# Patient Record
Sex: Male | Born: 1975 | Race: White | Hispanic: No | Marital: Married | Smoking: Former smoker
Health system: Southern US, Community
[De-identification: ages and names within clinical notes are randomized; demographics above are authoritative.]

## PROBLEM LIST (undated history)

## (undated) DIAGNOSIS — T7840XA Allergy, unspecified, initial encounter: Secondary | ICD-10-CM

## (undated) DIAGNOSIS — E785 Hyperlipidemia, unspecified: Secondary | ICD-10-CM

## (undated) DIAGNOSIS — I10 Essential (primary) hypertension: Secondary | ICD-10-CM

## (undated) DIAGNOSIS — K589 Irritable bowel syndrome without diarrhea: Secondary | ICD-10-CM

## (undated) DIAGNOSIS — K219 Gastro-esophageal reflux disease without esophagitis: Secondary | ICD-10-CM

## (undated) DIAGNOSIS — K449 Diaphragmatic hernia without obstruction or gangrene: Secondary | ICD-10-CM

## (undated) DIAGNOSIS — I2510 Atherosclerotic heart disease of native coronary artery without angina pectoris: Secondary | ICD-10-CM

## (undated) DIAGNOSIS — I214 Non-ST elevation (NSTEMI) myocardial infarction: Secondary | ICD-10-CM

## (undated) DIAGNOSIS — K2970 Gastritis, unspecified, without bleeding: Secondary | ICD-10-CM

## (undated) DIAGNOSIS — K2270 Barrett's esophagus without dysplasia: Secondary | ICD-10-CM

## (undated) DIAGNOSIS — N200 Calculus of kidney: Secondary | ICD-10-CM

## (undated) HISTORY — DX: Allergy, unspecified, initial encounter: T78.40XA

## (undated) HISTORY — PX: TYMPANOSTOMY TUBE PLACEMENT: SHX32

## (undated) HISTORY — DX: Calculus of kidney: N20.0

## (undated) HISTORY — DX: Non-ST elevation (NSTEMI) myocardial infarction: I21.4

## (undated) HISTORY — PX: UPPER GASTROINTESTINAL ENDOSCOPY: SHX188

## (undated) HISTORY — DX: Diaphragmatic hernia without obstruction or gangrene: K44.9

## (undated) HISTORY — DX: Gastritis, unspecified, without bleeding: K29.70

## (undated) HISTORY — PX: CARDIAC CATHETERIZATION: SHX172

## (undated) HISTORY — DX: Irritable bowel syndrome, unspecified: K58.9

## (undated) HISTORY — DX: Barrett's esophagus without dysplasia: K22.70

## (undated) HISTORY — DX: Hyperlipidemia, unspecified: E78.5

## (undated) HISTORY — DX: Essential (primary) hypertension: I10

## (undated) HISTORY — DX: Gastro-esophageal reflux disease without esophagitis: K21.9

---

## 2004-04-17 ENCOUNTER — Emergency Department (HOSPITAL_COMMUNITY): Admission: EM | Admit: 2004-04-17 | Discharge: 2004-04-17 | Payer: Self-pay | Admitting: Emergency Medicine

## 2004-11-25 ENCOUNTER — Ambulatory Visit: Payer: Self-pay | Admitting: Family Medicine

## 2004-12-26 ENCOUNTER — Ambulatory Visit: Payer: Self-pay | Admitting: Internal Medicine

## 2005-01-16 ENCOUNTER — Ambulatory Visit: Payer: Self-pay | Admitting: Internal Medicine

## 2005-05-13 ENCOUNTER — Ambulatory Visit: Payer: Self-pay | Admitting: Internal Medicine

## 2005-09-15 ENCOUNTER — Ambulatory Visit: Payer: Self-pay | Admitting: Internal Medicine

## 2006-01-19 ENCOUNTER — Ambulatory Visit: Payer: Self-pay | Admitting: Cardiology

## 2006-01-19 ENCOUNTER — Ambulatory Visit: Payer: Self-pay | Admitting: Internal Medicine

## 2006-04-23 ENCOUNTER — Ambulatory Visit: Payer: Self-pay | Admitting: Gastroenterology

## 2006-04-25 ENCOUNTER — Emergency Department (HOSPITAL_COMMUNITY): Admission: EM | Admit: 2006-04-25 | Discharge: 2006-04-25 | Payer: Self-pay | Admitting: Emergency Medicine

## 2006-05-15 ENCOUNTER — Ambulatory Visit: Payer: Self-pay | Admitting: Gastroenterology

## 2006-05-15 ENCOUNTER — Encounter (INDEPENDENT_AMBULATORY_CARE_PROVIDER_SITE_OTHER): Payer: Self-pay | Admitting: Specialist

## 2006-05-15 DIAGNOSIS — K2970 Gastritis, unspecified, without bleeding: Secondary | ICD-10-CM | POA: Insufficient documentation

## 2006-05-15 DIAGNOSIS — K2990 Gastroduodenitis, unspecified, without bleeding: Secondary | ICD-10-CM

## 2006-05-15 DIAGNOSIS — K2270 Barrett's esophagus without dysplasia: Secondary | ICD-10-CM | POA: Insufficient documentation

## 2006-06-16 ENCOUNTER — Ambulatory Visit: Payer: Self-pay | Admitting: Gastroenterology

## 2006-06-22 ENCOUNTER — Ambulatory Visit: Payer: Self-pay | Admitting: Gastroenterology

## 2006-07-17 ENCOUNTER — Ambulatory Visit: Payer: Self-pay | Admitting: Internal Medicine

## 2007-02-26 ENCOUNTER — Ambulatory Visit: Payer: Self-pay | Admitting: Gastroenterology

## 2007-03-15 ENCOUNTER — Observation Stay (HOSPITAL_COMMUNITY): Admission: EM | Admit: 2007-03-15 | Discharge: 2007-03-16 | Payer: Self-pay | Admitting: Emergency Medicine

## 2007-03-15 ENCOUNTER — Ambulatory Visit: Payer: Self-pay | Admitting: Internal Medicine

## 2007-03-18 ENCOUNTER — Telehealth (INDEPENDENT_AMBULATORY_CARE_PROVIDER_SITE_OTHER): Payer: Self-pay | Admitting: *Deleted

## 2007-11-17 ENCOUNTER — Encounter: Payer: Self-pay | Admitting: Gastroenterology

## 2007-11-17 DIAGNOSIS — K589 Irritable bowel syndrome without diarrhea: Secondary | ICD-10-CM

## 2007-11-17 DIAGNOSIS — K219 Gastro-esophageal reflux disease without esophagitis: Secondary | ICD-10-CM | POA: Insufficient documentation

## 2007-11-17 DIAGNOSIS — K449 Diaphragmatic hernia without obstruction or gangrene: Secondary | ICD-10-CM | POA: Insufficient documentation

## 2008-04-21 IMAGING — CR DG CHEST 2V
2 series · 2 of 2 positions shown · non-contrast
Comparison: none

CLINICAL DATA: Syncope, diarrhea.  Passing out.  Superior abdominal pain. 
 CHEST - 2 VIEW:

[w chest lat]
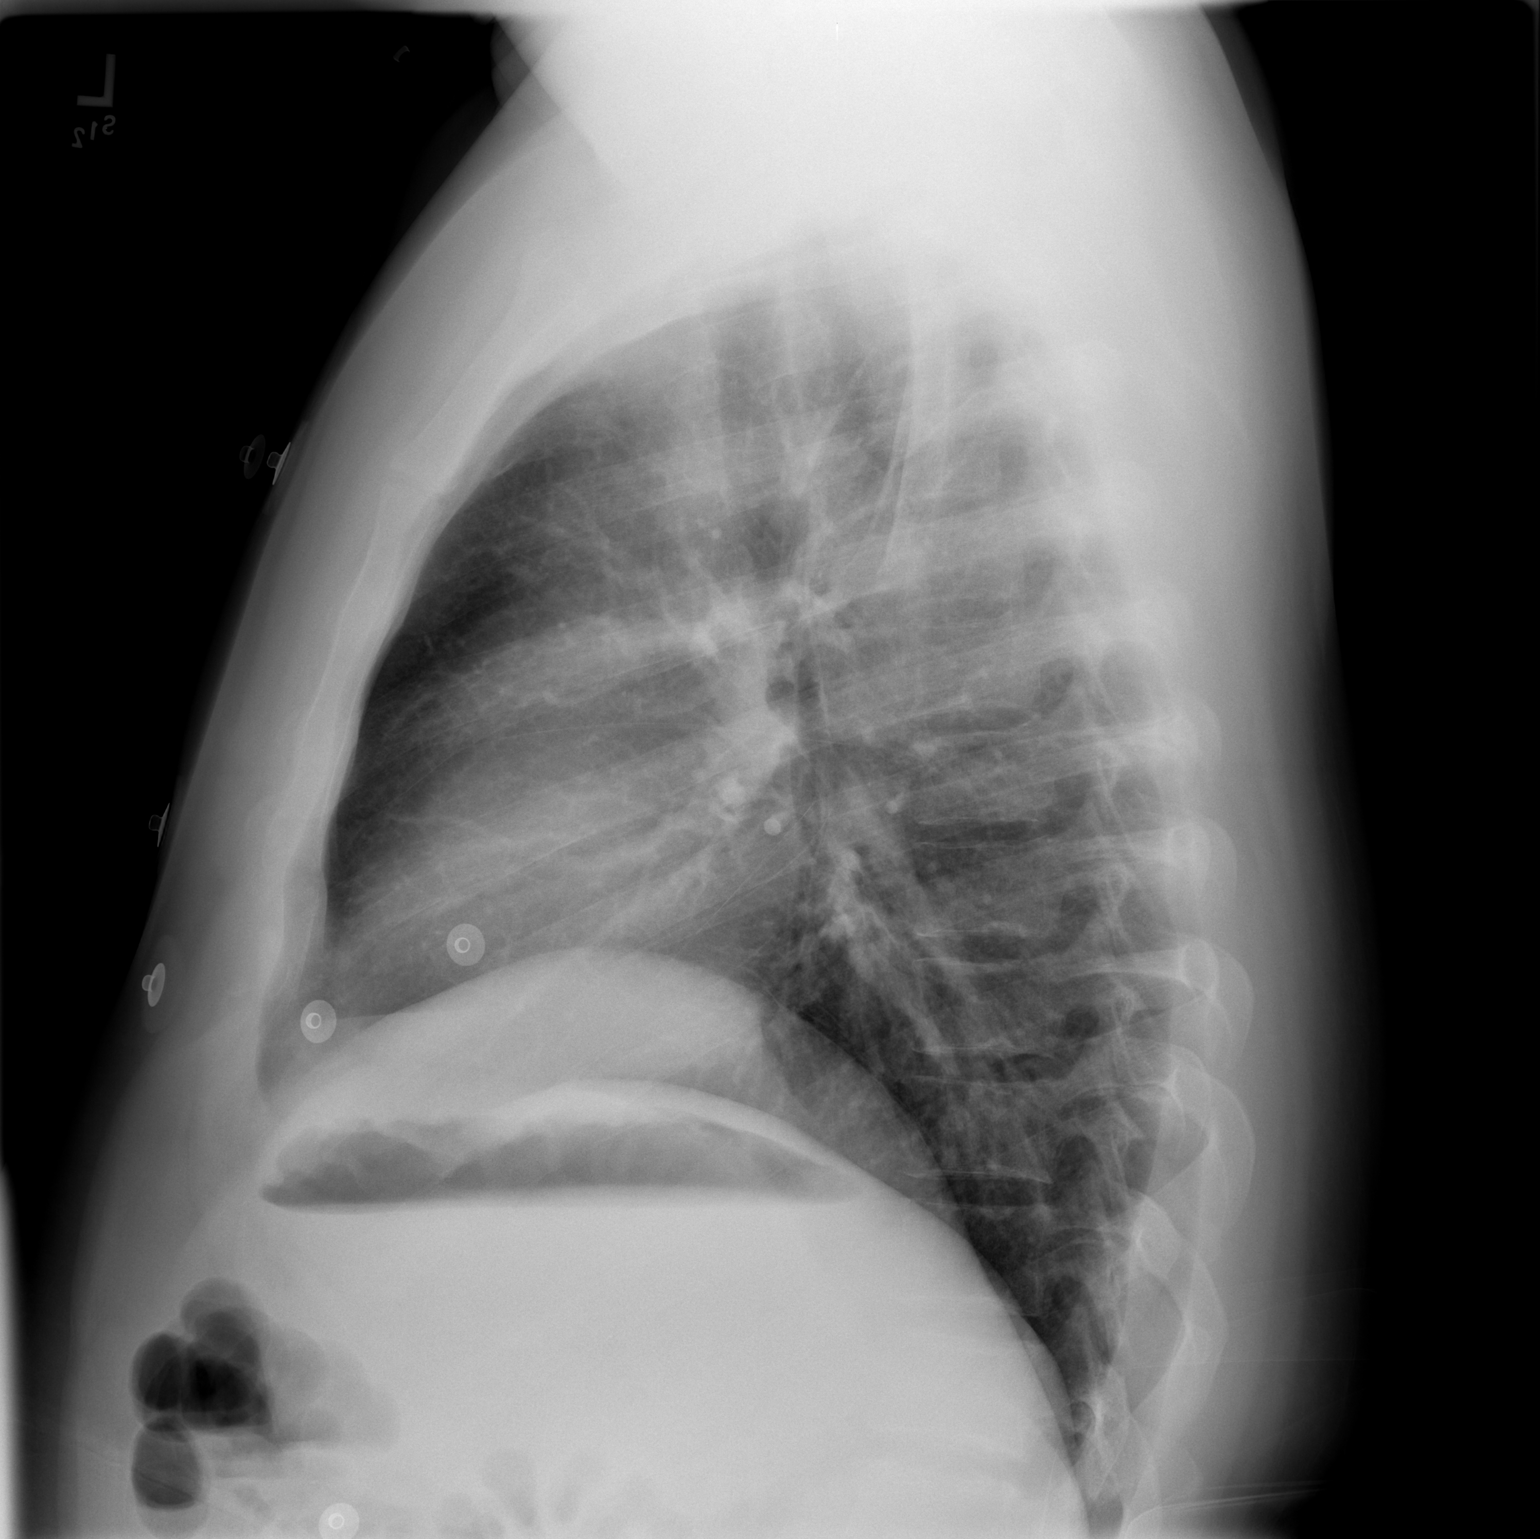

[w chest pa]
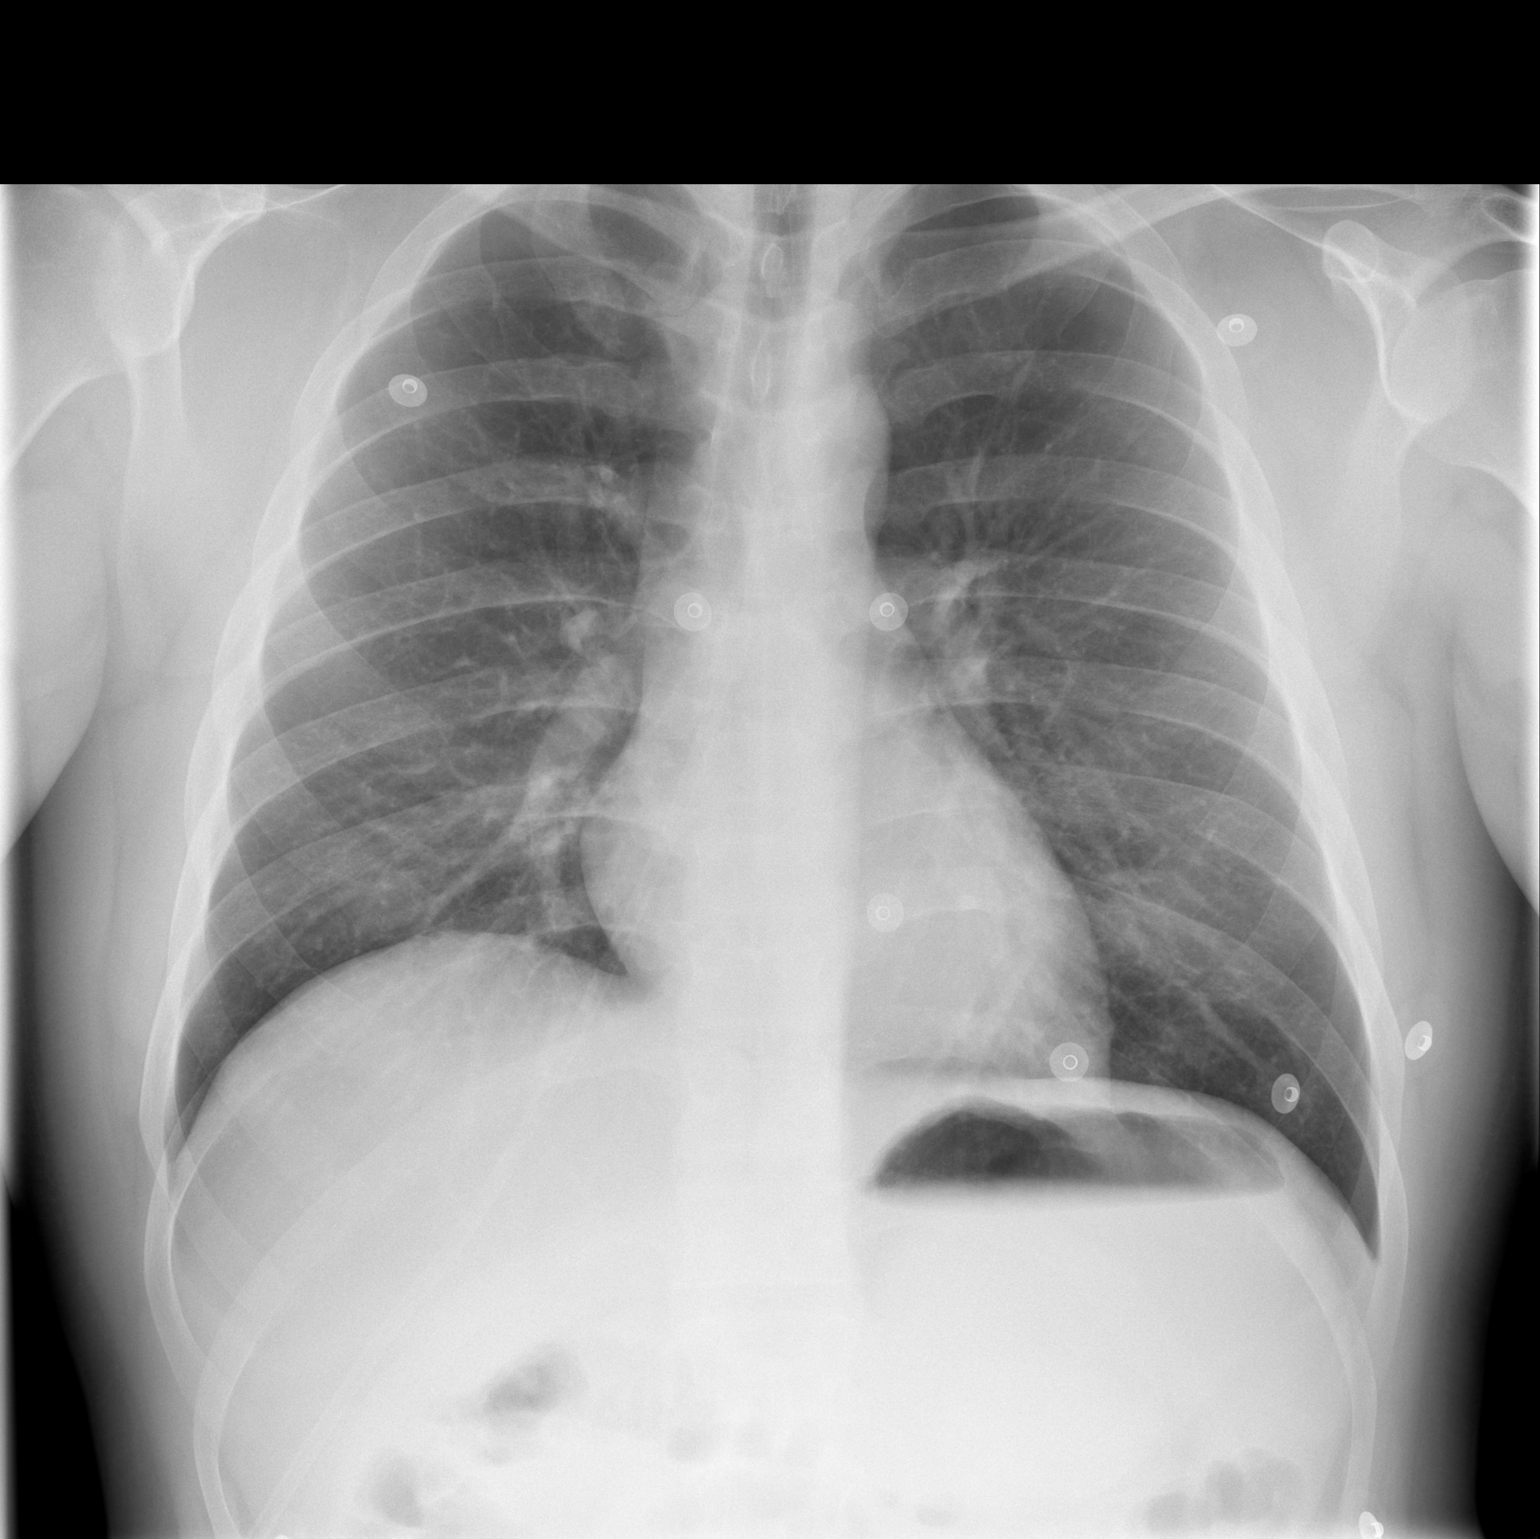

[2 of 2 positions shown; findings below may reference images not displayed]

FINDINGS: The heart size and mediastinal contours are within normal limits.  Both lungs are clear.  The visualized skeletal structures are unremarkable.
IMPRESSION: No active cardiopulmonary disease.

## 2008-04-21 IMAGING — CR DG ABDOMEN 2V
2 series · 2 of 2 positions shown · non-contrast
Comparison: None.

CLINICAL DATA: Nausea vomiting and diarrhea

[w abdomen upright]
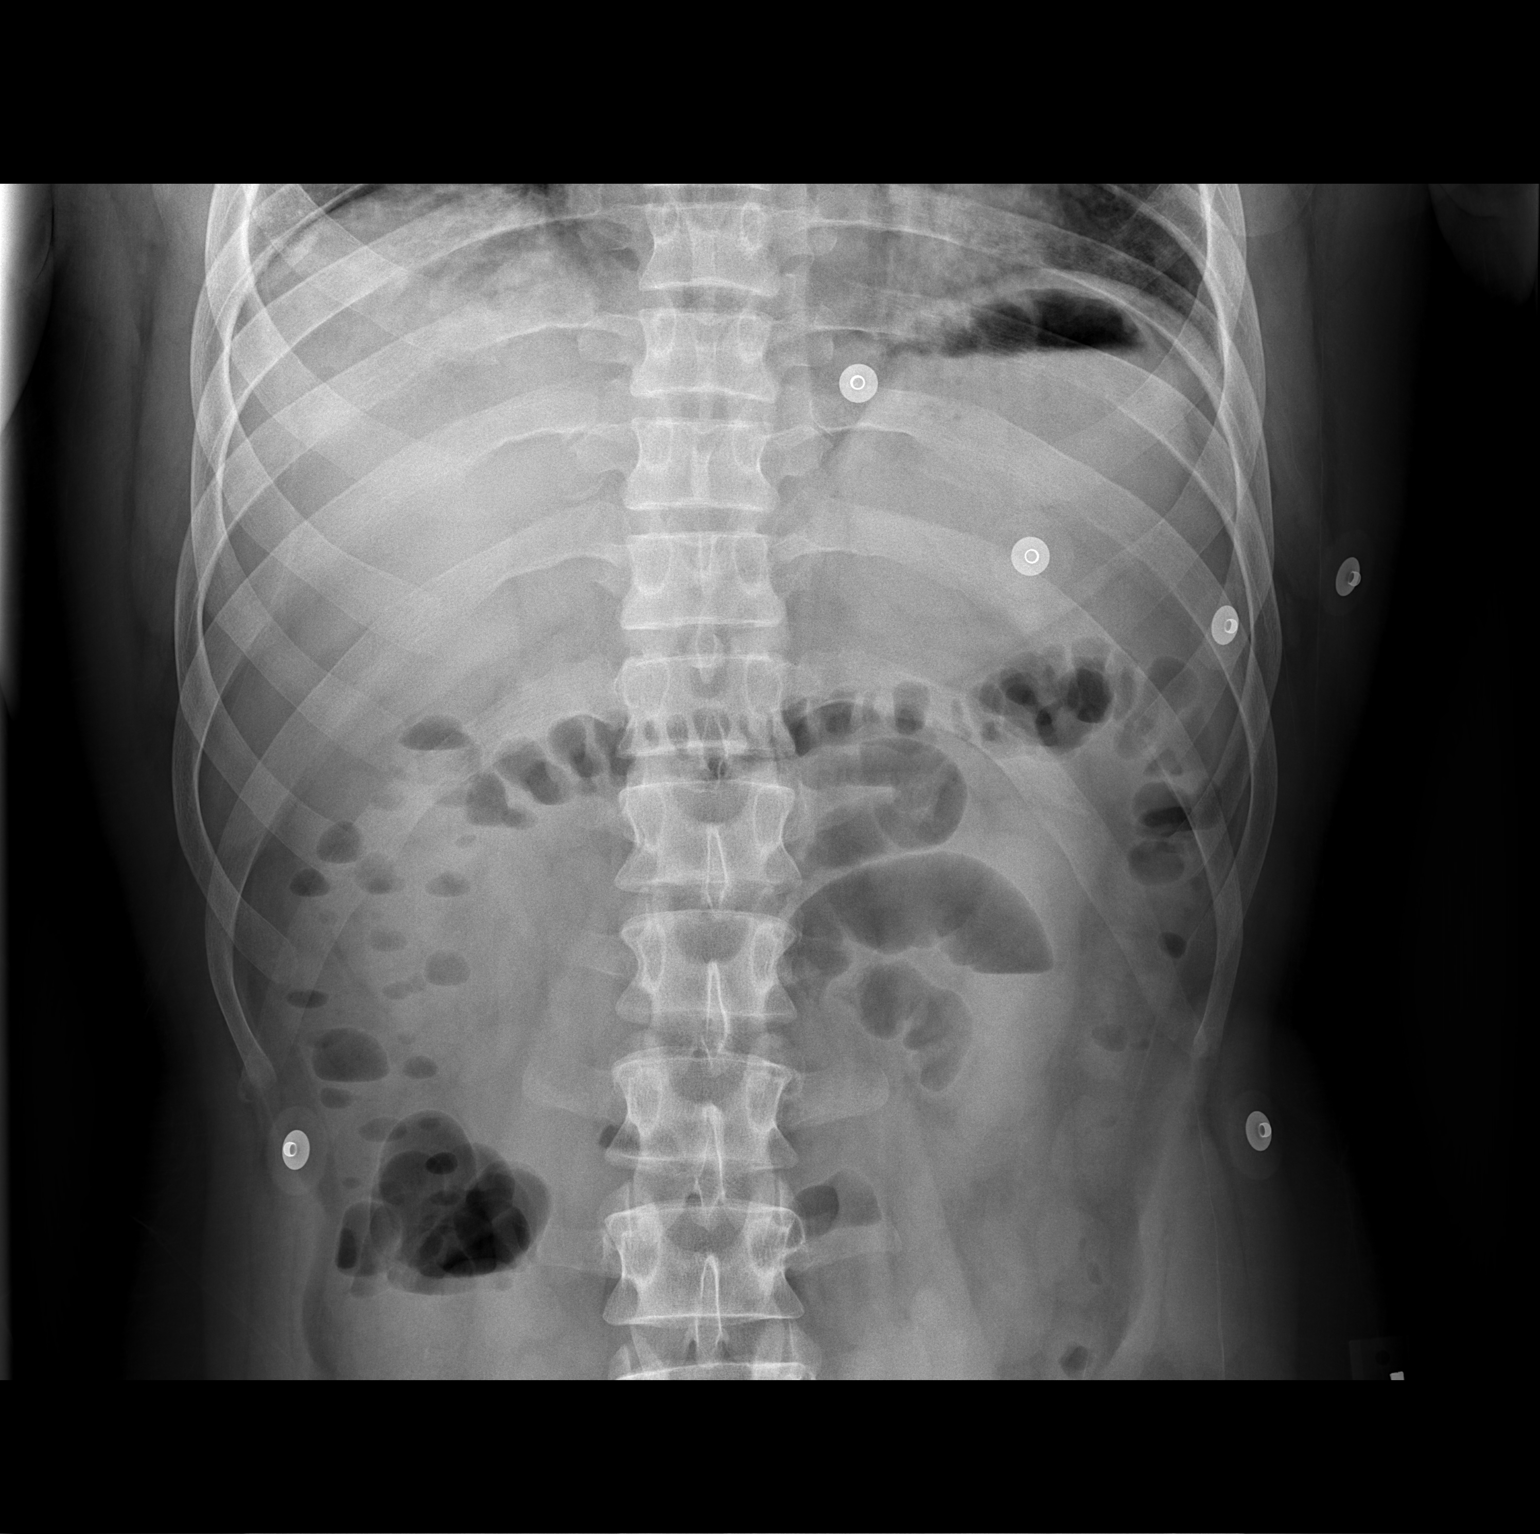

[t abdomen supine]
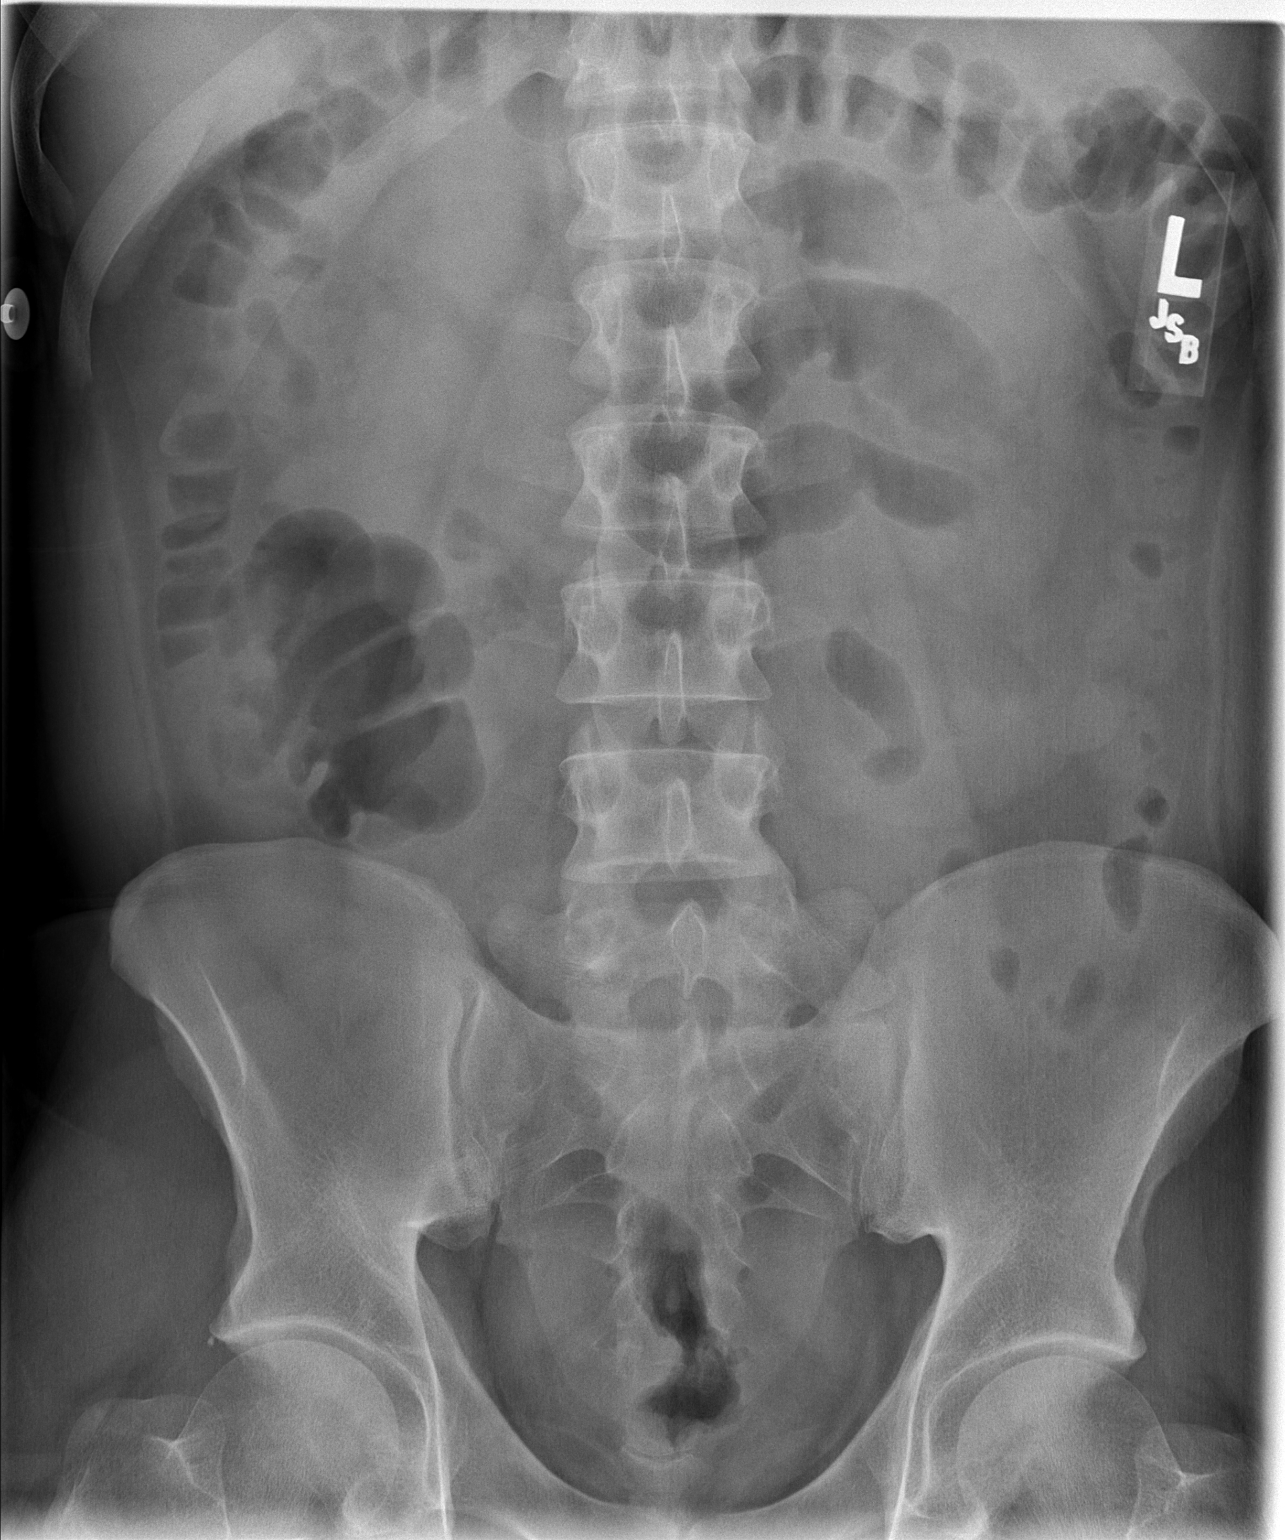

[2 of 2 positions shown; findings below may reference images not displayed]

ABDOMEN - 2 VIEW:

Upright film shows no intraperitoneal free air. There is bibasilar atelectasis
or infiltrate in the lung bases. Scattered tiny air-fluid levels are seen in the
nondilated colon. Some central loops of mildly distended small bowel are noted
in the upper abdomen.

Supine film shows no gaseous bowel dilation suggest obstruction.
IMPRESSION: No intraperitoneal free air.

Essentially nonspecific gas pattern. There is no gaseous bowel dilation to
suggest obstruction. Small air-fluid levels in the right colon can be a normal
finding. There is mild gaseous distention of small bowel in the central abdomen.

## 2008-06-06 ENCOUNTER — Telehealth (INDEPENDENT_AMBULATORY_CARE_PROVIDER_SITE_OTHER): Payer: Self-pay | Admitting: *Deleted

## 2008-06-07 ENCOUNTER — Ambulatory Visit: Payer: Self-pay | Admitting: Family Medicine

## 2008-06-07 DIAGNOSIS — IMO0001 Reserved for inherently not codable concepts without codable children: Secondary | ICD-10-CM | POA: Insufficient documentation

## 2008-06-15 ENCOUNTER — Telehealth (INDEPENDENT_AMBULATORY_CARE_PROVIDER_SITE_OTHER): Payer: Self-pay | Admitting: *Deleted

## 2008-06-16 ENCOUNTER — Ambulatory Visit: Payer: Self-pay | Admitting: Internal Medicine

## 2008-06-16 LAB — CONVERTED CEMR LAB
Inflenza A Ag: NEGATIVE
Influenza B Ag: NEGATIVE

## 2008-09-19 ENCOUNTER — Observation Stay (HOSPITAL_COMMUNITY): Admission: EM | Admit: 2008-09-19 | Discharge: 2008-09-20 | Payer: Self-pay | Admitting: Emergency Medicine

## 2008-09-22 ENCOUNTER — Ambulatory Visit: Payer: Self-pay | Admitting: Internal Medicine

## 2008-09-22 ENCOUNTER — Encounter (INDEPENDENT_AMBULATORY_CARE_PROVIDER_SITE_OTHER): Payer: Self-pay | Admitting: *Deleted

## 2008-09-22 DIAGNOSIS — K5289 Other specified noninfective gastroenteritis and colitis: Secondary | ICD-10-CM

## 2010-01-01 ENCOUNTER — Encounter (INDEPENDENT_AMBULATORY_CARE_PROVIDER_SITE_OTHER): Payer: Self-pay | Admitting: *Deleted

## 2010-01-01 ENCOUNTER — Ambulatory Visit: Payer: Self-pay | Admitting: Internal Medicine

## 2010-01-01 DIAGNOSIS — R42 Dizziness and giddiness: Secondary | ICD-10-CM | POA: Insufficient documentation

## 2010-01-01 DIAGNOSIS — R6884 Jaw pain: Secondary | ICD-10-CM | POA: Insufficient documentation

## 2010-01-01 DIAGNOSIS — R079 Chest pain, unspecified: Secondary | ICD-10-CM

## 2010-01-01 DIAGNOSIS — E785 Hyperlipidemia, unspecified: Secondary | ICD-10-CM

## 2010-01-02 ENCOUNTER — Telehealth: Payer: Self-pay | Admitting: Internal Medicine

## 2010-01-02 ENCOUNTER — Encounter: Payer: Self-pay | Admitting: Internal Medicine

## 2010-01-07 LAB — CONVERTED CEMR LAB
ALT: 17 units/L (ref 0–53)
AST: 18 units/L (ref 0–37)
Basophils Relative: 0.2 % (ref 0.0–3.0)
Bilirubin, Direct: 0.1 mg/dL (ref 0.0–0.3)
Chloride: 99 meq/L (ref 96–112)
Eosinophils Relative: 1.1 % (ref 0.0–5.0)
GFR calc non Af Amer: 87.28 mL/min (ref >60)
HCT: 45.7 % (ref 39.0–52.0)
Lymphs Abs: 1.3 10*3/uL (ref 0.7–4.0)
MCV: 90.6 fL (ref 78.0–100.0)
Monocytes Absolute: 0.4 10*3/uL (ref 0.1–1.0)
Monocytes Relative: 5.7 % (ref 3.0–12.0)
Neutrophils Relative %: 74.7 % (ref 43.0–77.0)
Potassium: 4.4 meq/L (ref 3.5–5.1)
RBC: 5.04 M/uL (ref 4.22–5.81)
Total Protein: 7.9 g/dL (ref 6.0–8.3)
WBC: 6.9 10*3/uL (ref 4.5–10.5)

## 2010-01-22 ENCOUNTER — Ambulatory Visit: Payer: Self-pay | Admitting: Internal Medicine

## 2010-01-22 DIAGNOSIS — J309 Allergic rhinitis, unspecified: Secondary | ICD-10-CM | POA: Insufficient documentation

## 2010-01-22 LAB — CONVERTED CEMR LAB: LDL Goal: 160 mg/dL

## 2010-02-15 ENCOUNTER — Telehealth (INDEPENDENT_AMBULATORY_CARE_PROVIDER_SITE_OTHER): Payer: Self-pay | Admitting: *Deleted

## 2010-04-24 ENCOUNTER — Telehealth (INDEPENDENT_AMBULATORY_CARE_PROVIDER_SITE_OTHER): Payer: Self-pay | Admitting: *Deleted

## 2010-04-26 ENCOUNTER — Ambulatory Visit: Payer: Self-pay | Admitting: Internal Medicine

## 2010-04-29 LAB — CONVERTED CEMR LAB
ALT: 15 units/L (ref 0–53)
Bilirubin, Direct: 0.1 mg/dL (ref 0.0–0.3)
Direct LDL: 291.5 mg/dL
HDL: 35.2 mg/dL — ABNORMAL LOW (ref >39.00)
Total Bilirubin: 0.9 mg/dL (ref 0.3–1.2)
Total CHOL/HDL Ratio: 9
Triglycerides: 88 mg/dL (ref 0.0–149.0)
VLDL: 17.6 mg/dL (ref 0.0–40.0)

## 2010-06-17 ENCOUNTER — Encounter: Payer: Self-pay | Admitting: Internal Medicine

## 2010-07-03 ENCOUNTER — Telehealth: Payer: Self-pay | Admitting: Internal Medicine

## 2010-07-10 ENCOUNTER — Encounter: Payer: Self-pay | Admitting: Internal Medicine

## 2010-07-10 ENCOUNTER — Ambulatory Visit: Payer: Self-pay | Admitting: Internal Medicine

## 2010-07-10 DIAGNOSIS — M2550 Pain in unspecified joint: Secondary | ICD-10-CM

## 2010-07-10 DIAGNOSIS — R259 Unspecified abnormal involuntary movements: Secondary | ICD-10-CM | POA: Insufficient documentation

## 2010-07-10 DIAGNOSIS — R5383 Other fatigue: Secondary | ICD-10-CM | POA: Insufficient documentation

## 2010-07-10 DIAGNOSIS — R5381 Other malaise: Secondary | ICD-10-CM

## 2010-07-10 DIAGNOSIS — R41840 Attention and concentration deficit: Secondary | ICD-10-CM | POA: Insufficient documentation

## 2010-07-12 LAB — CONVERTED CEMR LAB
AST: 25 units/L (ref 0–37)
Albumin: 4.5 g/dL (ref 3.5–5.2)
Alkaline Phosphatase: 60 units/L (ref 39–117)
BUN: 16 mg/dL (ref 6–23)
Basophils Relative: 0.2 % (ref 0.0–3.0)
Bilirubin, Direct: 0.1 mg/dL (ref 0.0–0.3)
Creatinine, Ser: 1.2 mg/dL (ref 0.4–1.5)
Eosinophils Relative: 1.4 % (ref 0.0–5.0)
GFR calc non Af Amer: 77.55 mL/min (ref >60.00)
Glucose, Bld: 110 mg/dL — ABNORMAL HIGH (ref 70–99)
HCT: 44.8 % (ref 39.0–52.0)
Hemoglobin: 15.3 g/dL (ref 13.0–17.0)
Hgb A1c MFr Bld: 5.6 % (ref 4.6–6.5)
Lymphs Abs: 2 10*3/uL (ref 0.7–4.0)
MCV: 89.7 fL (ref 78.0–100.0)
Monocytes Absolute: 0.4 10*3/uL (ref 0.1–1.0)
Monocytes Relative: 6.8 % (ref 3.0–12.0)
Neutro Abs: 3.6 10*3/uL (ref 1.4–7.7)
Potassium: 5.1 meq/L (ref 3.5–5.1)
RBC: 4.99 M/uL (ref 4.22–5.81)
Total CK: 86 units/L (ref 7–232)
Total Protein: 7.7 g/dL (ref 6.0–8.3)
WBC: 6.1 10*3/uL (ref 4.5–10.5)

## 2010-07-30 ENCOUNTER — Telehealth: Payer: Self-pay | Admitting: Internal Medicine

## 2010-08-01 ENCOUNTER — Ambulatory Visit
Admission: RE | Admit: 2010-08-01 | Discharge: 2010-08-01 | Payer: Self-pay | Source: Home / Self Care | Attending: Internal Medicine | Admitting: Internal Medicine

## 2010-08-01 ENCOUNTER — Encounter: Payer: Self-pay | Admitting: Internal Medicine

## 2010-08-01 DIAGNOSIS — M2550 Pain in unspecified joint: Secondary | ICD-10-CM | POA: Insufficient documentation

## 2010-08-20 ENCOUNTER — Encounter: Payer: Self-pay | Admitting: Internal Medicine

## 2010-09-03 NOTE — Progress Notes (Signed)
Summary: Additional labs/symptoms  Phone Note Call from Patient Call back at [phone]   Caller: Mom Summary of Call: Patient mom called noting that the patient recently has sinus infection and has even seen ENT. She notes that he would like a full blood work up , especially his iron levels. She would like to know if this can be added to labs he already needs (Lipids, hepatic panel, CK ; 272.4, 995.2). Please advise. Initial call taken by: Lucious Groves CMA,  July 03, 2010 12:30 PM  Follow-up for Phone Call        CBC & dif ( 461.0) , iron could done if anemic. Otherwise insurance won't cover it Follow-up by: Marga Melnick MD,  July 03, 2010 1:24 PM  Additional Follow-up for Phone Call Additional follow up Details #1::        Patient advised of the above and notes that he is having fatigue, muscles feel weak/poor motor skills, slight dizziness, and confusion.   Appt made next week per patient request. Additional Follow-up by: Lucious Groves CMA,  July 03, 2010 3:27 PM

## 2010-09-03 NOTE — Consult Note (Signed)
Summary: [facility]  [facility]   Imported By: Lanelle Bal 06/26/2010 13:44:58  _____________________________________________________________________  External Attachment:    Type:   Image     Comment:   External Document

## 2010-09-03 NOTE — Assessment & Plan Note (Signed)
Summary: congested -painin head  above neck/cbs   Vital Signs:  Patient profile:   35 year old male Height:      70 inches Weight:      221 pounds BMI:     31.82 BSA:     2.18 Temp:     97.5 degrees F oral Pulse rate:   96 / minute Pulse rhythm:   regular Resp:     16 per minute BP sitting:   116 / 74  (left arm) Cuff size:   large  Vitals Entered By: Ok Anis CMA (January 22, 2010 9:20 AM)                               CC:  Pressure in head and and ear ache.  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 NMR reviewed & risk discussed. He quit smoking 5 years ago. He is physically active on his job @ [employer]. No specific diet but decreased portions.  Lipid Management History:      Positive NCEP/ATP III risk factors include family history for ischemic heart disease (females less than 80 years old & males less than 55 years old).  Negative NCEP/ATP III risk factors include male age less than 66 years old, non-diabetic, non-tobacco-user status, non-hypertensive, no ASHD (atherosclerotic heart disease), no prior stroke/TIA, no peripheral vascular disease, and no history of aortic aneurysm.     Preventive Screening-Counseling & Management  Alcohol-Tobacco     Smoking Status: quit  Current Medications (verified): 1)  Ranitidine Hcl 150 Mg Tabs (Ranitidine Hcl) .Marland Kitchen.. 1 Two Times A Day Pre Meals As Needed 2)  Claritin 10 Mg Tabs (Loratadine) .... One Tablet By Mouth Once Daily  Allergies (verified): 1)  ! Aciphex (Rabeprazole Sodium) 2)  ! Nexium (Esomeprazole Magnesium) 3)  ! Zegerid (Omeprazole-Sodium Bicarbonate)  Past History:  Past Medical History: GERD (ICD-530.81) Hx of IBS (ICD-564.1) GASTRITIS (ICD-535.50) BARRETTS ESOPHAGUS (ICD-530.85) HIATAL HERNIA (ICD-553.3) Hyperlipidemia: NMR 2011: LDL 347(>3500/3223), HDL 34, TG 149.LDL goal = < 140, ideally < 105. Framingham Study LDL goal = <  160.  Social History: Smoking Status:  quit  Review of Systems General:  Complains of sweats; denies chills and fever. ENT:  Complains of nasal congestion and postnasal drainage; No purulence.  Physical Exam  General:  well-nourished,in no acute distress; alert,appropriate and cooperative throughout examination Ears:  L ear normal.   Wax on R Nose:  External nasal examination shows no deformity or inflammation. Nasal mucosa are pink and moist without lesions or exudates. Mouth:  Oral mucosa and oropharynx without lesions or exudates.  Teeth in good repair. Tonsils enlarged Heart:  Normal rate and regular rhythm. S1 and S2 normal without gallop, murmur, click, rub or other extra sounds. Pulses:  R and L carotid,radial,dorsalis pedis and posterior tibial pulses are full and equal bilaterally Extremities:  Small xanthelasma over R dorsal MCP joints Neurologic:  alert & oriented X3.     Impression & Recommendations:  Problem # 1:  HYPERLIPIDEMIA (ICD-272.4)  His updated medication list for this problem includes:    Pravastatin Sodium 40 Mg Tabs (Pravastatin sodium) .Marland Kitchen... 1 at bedtime  Problem # 2:  RHINITIS (ICD-477.9)  His updated medication list for this problem includes:    Claritin 10 Mg Tabs (Loratadine) ..... One tablet by mouth once daily    Fluticasone Propionate 50 Mcg/act Susp (Fluticasone  propionate) .Marland Kitchen... 1 spray two times a day as needed  Complete Medication List: 1)  Ranitidine Hcl 150 Mg Tabs (Ranitidine hcl) .Marland Kitchen.. 1 two times a day pre meals as needed 2)  Claritin 10 Mg Tabs (Loratadine) .... One tablet by mouth once daily 3)  Fluticasone Propionate 50 Mcg/act Susp (Fluticasone propionate) .Marland Kitchen.. 1 spray two times a day as needed 4)  Pravastatin Sodium 40 Mg Tabs (Pravastatin sodium) .Marland Kitchen.. 1 at bedtime  Lipid Assessment/Plan:      Based on NCEP/ATP III, the patient's risk factor category is "0-1 risk factors".  The patient's lipid goals are as follows: Total  cholesterol goal is 200; LDL cholesterol goal is 160; HDL cholesterol goal is 40; Triglyceride goal is 150.  His LDL cholesterol goal has not been met.  Secondary causes for hyperlipidemia have been ruled out.  He has been counseled on adjunctive measures for lowering his cholesterol and has been provided with dietary instructions.    Patient Instructions: 1)  Neti pot once daily as needed for head congestion. 2)  Please schedule a follow-up appointment in 3 months. 3)  Hepatic Panel prior to visit, ICD-9:995.20 4)  Lipid Panel prior to visit, ICD-9:272.4 5)  Take an  81 mg COATED Aspirin every day 1/2 through b'fast. Read Dr Gildardo Griffes book Eat, Drink & Be Healthy for dietary information on cholesterol.. Prescriptions: PRAVASTATIN SODIUM 40 MG TABS (PRAVASTATIN SODIUM) 1 at bedtime  #90 x 0   Entered and Authorized by:   Marga Melnick MD   Signed by:   Marga Melnick MD on 01/22/2010   Method used:   Print then Give to Patient   RxID:   [id number] FLUTICASONE PROPIONATE 50 MCG/ACT SUSP (FLUTICASONE PROPIONATE) 1 spray two times a day as needed  #1 x 11   Entered and Authorized by:   Marga Melnick MD   Signed by:   Marga Melnick MD on 01/22/2010   Method used:   Print then Give to Patient   RxID:   [id number]

## 2010-09-03 NOTE — Progress Notes (Signed)
Summary: Referral-Urgent  Phone Note Call from Patient   Caller: Mom Summary of Call: Mother calls and states that she wants a referral for her son for Dr. Elba Barman and Throat. Son has seen Dr. Alwyn Ren twice with the same issues..headache,ear problems etc. She states that they believed maybe going to the beach would help him but he has still been sick. She states that he has been using the nedipod  like Dr. Alwyn Ren told him to but its just a temporary fix. Wants a referral to the specalist for Monday if possible. Trey Paula will still be off work. Please contact pt at [phone] today to let him know if he can see Dr. Jearld Fenton Monday.  Initial call taken by: Lavell Islam,  February 15, 2010 12:19 PM  Follow-up for Phone Call        Referral placed, Marisue Ivan( referral coor.) is aware  Follow-up by: Shonna Chock CMA,  February 15, 2010 1:25 PM

## 2010-09-03 NOTE — Progress Notes (Signed)
Summary: PRAVASTATIN 30 DAY SUPPLY  Phone Note Call from Patient   Caller: Mom Summary of Call: NEEDS 30 DAY REFILL FOR PRAVASTATIN CALLED TO [facility] [facility] [place]  PATIENT THINKS DR HOPPER WILL NEED TO REVIEW LABS (WILL DO LABS ON FRIDAY 9/23) AND MAY CHANGE MEDS BASED ON LABS, SO HE ONLY WANTS A 30 DAY SUPPLY Initial call taken by: Jerolyn Shin,  April 24, 2010 3:21 PM    Prescriptions: PRAVASTATIN SODIUM 40 MG TABS (PRAVASTATIN SODIUM) 1 at bedtime  #30 x 0   Entered by:   Shonna Chock CMA   Authorized by:   Marga Melnick MD   Signed by:   Shonna Chock CMA on 04/24/2010   Method used:   Electronically to        [facility]retail[facility]       [address]       [place], [place]  [address]       Ph: [phone]       Fax: [phone]   RxID:   [id number]

## 2010-09-03 NOTE — Assessment & Plan Note (Signed)
Summary: C/O fatigue, poor motor skills confusion,requesting iron stud...   Vital Signs:  Patient profile:   35 year old male Weight:      220.2 pounds BMI:     31.71 Temp:     97.7 degrees F oral Pulse rate:   96 / minute Resp:     14 per minute BP sitting:   126 / 80  (left arm) Cuff size:   large  Vitals Entered By: Shonna Chock CMA (July 10, 2010 9:08 AM) CC: C/O of several symptoms: fatigue, eye twitching/pressure, ear pain, congestion, cough, joint pain, and hard time concentrating. Symptoms off/on x 1.5 year(s) and worse in the fall season, Fatigue   CC:  C/O of several symptoms: fatigue, eye twitching/pressure, ear pain, congestion, cough, joint pain, and hard time concentrating. Symptoms off/on x 1.5 year(s) and worse in the fall season, and Fatigue.  History of Present Illness: Fatigue      This is a 35 year old man who presents with intermittent fatigue , joint pains diffusely , muscle weakness & aching.Intermittent twitching OD.  The patient reports  fatigue even  without physical  activity ,  primarily a  physical , not motivational fatigue.  The patient also reports intermittent  night sweats and dyspnea.  The patient denies fever, weight loss, exertional chest pain, cough, and hemoptysis.  Other symptoms include severe snoring w/o apnea.  The patient denies the following symptoms: leg swelling, orthopnea, PND, melena, adenopathy, daytime sleepiness, and skin changes.  The patient denies feeling depressed, altered appetite, and poor sleep.  On Simvastatin 6 weeks.   Current Medications (verified): 1)  Zyrtec Hives Relief 10 Mg Tabs (Cetirizine Hcl) .Marland Kitchen.. 1 By Mouth Once Daily 2)  Simvastatin 40 Mg Tabs (Simvastatin) .Marland Kitchen.. 1 At Bedtime 3)  Aspirin 81 Mg Tabs (Aspirin) .Marland Kitchen.. 1 By Mouth At Bedtime  Allergies: 1)  ! Aciphex (Rabeprazole Sodium) 2)  ! Nexium (Esomeprazole Magnesium) 3)  ! Zegerid (Omeprazole-Sodium Bicarbonate)  Review of Systems General:  Denies  chills. Eyes:  Denies blurring, double vision, and vision loss-both eyes. ENT:  Denies difficulty swallowing and hoarseness. CV:  Denies palpitations. GI:  Denies constipation and diarrhea. MS:  Denies joint redness, joint swelling, low back pain, and stiffness. Derm:  Denies changes in nail beds, hair loss, lesion(s), and rash. Neuro:  Complains of difficulty with concentration and memory loss; denies numbness; R facial tingling. Memory & concentration as well as other symptoms  better with multigrain cereals. Endo:  Denies cold intolerance and heat intolerance.  Physical Exam  General:  well-nourished,in no acute distress; alert,appropriate and cooperative throughout examination Eyes:  No corneal or conjunctival inflammation noted. EOMI. Perrla. Field of  Vision grossly normal. no icterus Mouth:  Oral mucosa and oropharynx without lesions or exudates.  Tongue not deviated Neck:  No deformities, masses, or tenderness noted. Lungs:  Normal respiratory effort, chest expands symmetrically. Lungs are clear to auscultation, no crackles or wheezes. Heart:  Normal rate and regular rhythm. S1 and S2 normal without gallop, murmur, click, rub or other extra sounds. Abdomen:  Bowel sounds positive,abdomen soft and non-tender without masses, organomegaly or hernias noted. Pulses:  R and L carotid,radial,dorsalis pedis and posterior tibial pulses are full and equal bilaterally Extremities:  No clubbing, cyanosis, edema, or deformity noted with normal full range of motion of all joints.   Neurologic:  alert & oriented X3 and DTRs symmetrical and normal.   Skin:  Intact without suspicious lesions or rashes Cervical Nodes:  No lymphadenopathy noted Axillary Nodes:  No palpable lymphadenopathy Psych:  memory intact for recent and remote, normally interactive, and good eye contact.     Impression & Recommendations:  Problem # 1:  FATIGUE (ICD-780.79)  Orders: Venipuncture (40981) TLB-CBC Platelet -  w/Differential (85025-CBCD) TLB-B12 + Folate Pnl (19147_82956-O13/YQM) TLB-TSH (Thyroid Stimulating Hormone) (84443-TSH)  Problem # 2:  ATTENTION OR CONCENTRATION DEFICIT (ICD-799.51)  Orders: Venipuncture (57846) TLB-B12 + Folate Pnl (96295_28413-K44/WNU)  Problem # 3:  MUSCLE FASCICULATIONS (ICD-781.0)  Orders: Venipuncture (27253) TLB-BMP (Basic Metabolic Panel-BMET) (80048-METABOL) TLB-CK Total Only(Creatine Kinase/CPK) (82550-CK) TLB-Magnesium (Mg) (83735-MG)  Problem # 4:  HYPERLIPIDEMIA (ICD-272.4)  His updated medication list for this problem includes:    Simvastatin 40 Mg Tabs (Simvastatin) .Marland Kitchen... 1 at bedtime  Orders: Venipuncture (66440) TLB-Hepatic/Liver Function Pnl (80076-HEPATIC) T- * Misc. Laboratory test [phone])  Problem # 5:  CORONARY ARTERY DISEASE, PREMATURE, FAMILY HX (ICD-V17.3)  Orders: Venipuncture (59563) T- * Misc. Laboratory test [phone])  Complete Medication List: 1)  Zyrtec Hives Relief 10 Mg Tabs (Cetirizine hcl) .Marland Kitchen.. 1 by mouth once daily 2)  Simvastatin 40 Mg Tabs (Simvastatin) .Marland Kitchen.. 1 at bedtime 3)  Aspirin 81 Mg Tabs (Aspirin) .Marland Kitchen.. 1 by mouth at bedtime  Other Orders: TLB-Sedimentation Rate (ESR) (85652-ESR) T-Rheumatoid Factor (33295-18841)  Patient Instructions: 1)  Verify with spouse as to any apnea   Orders Added: 1)  Est. Patient Level IV [66063] 2)  Venipuncture [01601] 3)  TLB-CBC Platelet - w/Differential [85025-CBCD] 4)  TLB-B12 + Folate Pnl [82746_82607-B12/FOL] 5)  TLB-Hepatic/Liver Function Pnl [80076-HEPATIC] 6)  TLB-TSH (Thyroid Stimulating Hormone) [84443-TSH] 7)  TLB-BMP (Basic Metabolic Panel-BMET) [80048-METABOL] 8)  TLB-CK Total Only(Creatine Kinase/CPK) [82550-CK] 9)  TLB-Magnesium (Mg) [83735-MG] 10)  TLB-Sedimentation Rate (ESR) [85652-ESR] 11)  T-Rheumatoid Factor [09323-55732] 12)  T- * Misc. Laboratory test [phone]  Appended Document: C/O fatigue, poor motor skills confusion,requesting iron  stud.Marland KitchenMarland Kitchen

## 2010-09-03 NOTE — Assessment & Plan Note (Signed)
Summary: vomiting,dizzy/cbs   Vital Signs:  Patient profile:   35 year old male Weight:      220.4 pounds O2 Sat:      97 % Temp:     98.1 degrees F oral Pulse rate:   107 / minute Resp:     16 per minute BP supine:   110 / 78  (left arm) BP sitting:   112 / 80  (left arm) BP standing:   124 / 90  (left arm) Cuff size:   large  Vitals Entered By: Shonna Chock (Jan 01, 2010 12:19 PM) CC: Stomach bug, chest pain, dizzy, off balance, jaw pain, and  ear pain., Diarrhea Comments REVIEWED MED LIST, PATIENT AGREED DOSE AND INSTRUCTION CORRECT  Pulse Supine: 100, Pulse Standing: 105   CC:  Stomach bug, chest pain, dizzy, off balance, jaw pain, and  ear pain., and Diarrhea.  History of Present Illness: Onset as bilateral jaw pain 3-4 weeks ago followed by dizziness & lightheadedness  for 10-15 min  intermittently since . Acute onset 12/31/2009 as temp to 102 with arthralgias & loose stool X 4. Over past 18 hrs 15 BMS , frank diarrhea this am. PMH of hospitalization X 2 in 2008 & 2010  with  diarrhea , N& V  & syncope diagnosed as "Noro Virus". His wife is a Electrical engineer; she is well.   The patient reports >6 stools per day, watery/unformed stools, voluminous stools, malodorous stools, fecal urgency, fasting diarrhea, gassiness, and abrupt onset of symptoms, but denies blood in stool, mucus in stool, greasy stools, fecal soiling, alternating diarrhea/constipation, nocturnal diarrhea, and bloating.  Associated symptoms include fever, abdominal cramps, nausea, and lightheadedness.  The patient denies vomiting, increased thirst, joint pains, mouth ulcers, and eye redness.  No triggers or relievers. PMH of Barrett's Esophagus; intolerant to all meds except Nexium , but it caused "trapped gas".  Allergies: 1)  ! Aciphex (Rabeprazole Sodium) 2)  ! Nexium (Esomeprazole Magnesium) 3)  ! Zegerid (Omeprazole-Sodium Bicarbonate)  Review of Systems Eyes:  Eye doctor stated "cholesterol seen in my  eye"  4 mos ago. ENT:  Denies difficulty swallowing and hoarseness. CV:  Complains of chest pain or discomfort; denies leg cramps with exertion; DOE only after newly painted room. He had chest pain most recently last week. Resp:  Denies chest pain with inspiration, cough, coughing up blood, sputum productive, and wheezing. MS:  Complains of joint pain; denies joint redness and joint swelling. Derm:  Denies lesion(s) and rash.  Physical Exam  General:  well-nourished,in no acute distress; alert,appropriate and cooperative throughout examination Eyes:  No corneal or conjunctival inflammation noted.Perrla. No icterus Mouth:  Oral mucosa and oropharynx without lesions or exudates.  Teeth in good repair. No pharyngeal erythema.   Tongue moist Lungs:  Normal respiratory effort, chest expands symmetrically. Lungs are clear to auscultation, no crackles or wheezes. Heart:  Regular rhythm. S1 and S2 normal without gallop, murmur, click, rub or other extra sounds.S4 tach Abdomen:  Bowel sounds positive,abdomen soft  slightly  tender  LLQ without masses, organomegaly or hernias noted. Pulses:  R and L carotid,radial,dorsalis pedis and posterior tibial pulses are full and equal bilaterally Extremities:  No clubbing, cyanosis, edema. Neurologic:  alert & oriented X3.   Skin:  Intact without suspicious lesions or rashes. No jaundice Cervical Nodes:  No lymphadenopathy noted Axillary Nodes:  No palpable lymphadenopathy Psych:  memory intact for recent and remote, normally interactive, good eye contact, and not anxious appearing.  Impression & Recommendations:  Problem # 1:  DIARRHEA (ICD-787.91)  Orders: Venipuncture (09811) TLB-BMP (Basic Metabolic Panel-BMET) (80048-METABOL) TLB-CBC Platelet - w/Differential (85025-CBCD) TLB-Hepatic/Liver Function Pnl (80076-HEPATIC) T-Stool for O&P (91478-29562) T-Stool Giardia / Crypto- EIA (13086) T-Culture, Stool (87045/87046-70140) T-Culture, C-Diff  Toxin A/B (57846-96295)  His updated medication list for this problem includes:    Diphenoxylate-atropine 2.5-0.025 Mg Tabs (Diphenoxylate-atropine) .Marland Kitchen... 1 as needed watery stool  Problem # 2:  FEVER (ICD-780.60)  Orders: Venipuncture (28413) TLB-CBC Platelet - w/Differential (85025-CBCD) TLB-Hepatic/Liver Function Pnl (80076-HEPATIC) T-Stool for O&P (24401-02725) T-Stool Giardia / Crypto- EIA (36644) T-Culture, Stool (87045/87046-70140) T-Culture, C-Diff Toxin A/B (03474-25956)  Problem # 3:  DIZZINESS (ICD-780.4)  Orders: TLB-BMP (Basic Metabolic Panel-BMET) (80048-METABOL)  Problem # 4:  NAUSEA (ICD-787.02)  Problem # 5:  JAW PAIN (LOV-564.33)  Problem # 6:  CHEST PAIN (ICD-786.50)  intermittent  Orders: EKG w/ Interpretation (93000) Venipuncture (29518)  Problem # 7:  HYPERLIPIDEMIA (ICD-272.4)  Orders: Venipuncture (84166) T-NMR, Lipoprofile (06301-60109)  Problem # 8:  CORONARY ARTERY DISEASE, PREMATURE, FAMILY HX (ICD-V17.3)  Orders: T-NMR, Lipoprofile (32355-73220)  Complete Medication List: 1)  Ranitidine Hcl 150 Mg Tabs (Ranitidine hcl) .Marland Kitchen.. 1 two times a day pre meals as needed 2)  Diphenoxylate-atropine 2.5-0.025 Mg Tabs (Diphenoxylate-atropine) .Marland Kitchen.. 1 as needed watery stool 3)  Doxycycline Hyclate 100 Mg Caps (Doxycycline hyclate) .... Two times a day for 7 days. avoid direct sunlight. 4)  Tramadol Hcl 50 Mg Tabs (Tramadol hcl) .Marland Kitchen.. 1 by mouth every 6 hours as needed for pain.  Patient Instructions: 1)  Drink clear liquids only for the next 24 hours, then slowly add other liquids and food as you  tolerate them. Prescriptions: TRAMADOL HCL 50 MG TABS (TRAMADOL HCL) 1 by mouth every 6 hours as needed for pain.  #20 x 0   Entered by:   Army Fossa CMA   Authorized by:   Marga Melnick MD   Signed by:   Army Fossa CMA on 01/02/2010   Method used:   Electronically to        [id number] (retail)       [address]  [place], [place]  [address]       Ph: [phone]       Fax: [phone]   RxID:   [id number] DOXYCYCLINE HYCLATE 100 MG CAPS (DOXYCYCLINE HYCLATE) two times a day for 7 days. Avoid direct sunlight.  #14 x 0   Entered by:   Army Fossa CMA   Authorized by:   Marga Melnick MD   Signed by:   Army Fossa CMA on 01/02/2010   Method used:   Electronically to        [id number] (retail)       [address] [place], [place]  [address]       Ph: [phone]       Fax: [phone]   RxID:   [id number] DIPHENOXYLATE-ATROPINE 2.5-0.025 MG TABS (DIPHENOXYLATE-ATROPINE) 1 as needed watery stool  #12 x 0   Entered and Authorized by:   Marga Melnick MD   Signed by:   Marga Melnick MD on 01/01/2010   Method used:   Print then Give to Patient   RxID:   [id number]

## 2010-09-03 NOTE — Progress Notes (Signed)
Summary: Head congestion  Phone Note Call from Patient Call back at [phone]   Caller: Patient Summary of Call: Pt states that he saw Dr.Ticara Waner yesterday and Dr.Sheryl Saintil addressed his stomach issues. He said that he has a HA, and head congestion. He would like to know if there is anything Dr.Hermenia Fritcher can do for this issue since he just saw him yesterday. Please advise. Army Fossa CMA  January 02, 2010 1:40 PM   Follow-up for Phone Call        Tramadol 50 mg 1 q 6 hrs as needed pain #20; Doxycycline 100 mg two times a day 7 days . Avoid  direct sun on  this Follow-up by: Marga Melnick MD,  January 02, 2010 3:27 PM  Additional Follow-up for Phone Call Additional follow up Details #1::        Pt is aware, meds called in. Had to call meds on the OV. Army Fossa CMA  January 02, 2010 3:32 PM

## 2010-09-03 NOTE — Letter (Signed)
Summary: Out of Work  [facility] [employer]  [address] [place], [place] [address]   Phone: [phone]  Fax: [phone]    Jan 01, 2010   Employee:  Koen B Schumm    To Whom It May Concern:   For Medical reasons, please excuse the above named employee from work for the following dates:  Start:  Jan 01, 2010   End:   January 02, 2010  If you need additional information, please feel free to contact our office.         Sincerely,       Chrissie Noa Hopper,MD

## 2010-09-05 NOTE — Assessment & Plan Note (Signed)
Summary: DISCUSS MEDICATION AND [facility]   Vital Signs:  Patient profile:   35 year old male Weight:      217.2 pounds BMI:     31.28 Temp:     98.0 degrees F oral Pulse rate:   72 / minute Resp:     15 per minute BP sitting:   116 / 78  (right arm) Cuff size:   large  Vitals Entered By: Shonna Chock CMA (August 01, 2010 8:24 AM) CC: Follow-up visit: discuss [facility] and side effects of Simvastatin   CC:  Follow-up visit: discuss [facility] and side effects of Simvastatin.  History of Present Illness:      This is a 35 year old man who presents for Hyperlipidemia follow-up.  The patient reports muscle aches, itching,   constipation &  multiple symptoms ( see list) which resolved off Simvastatin.He  denies GI upset, abdominal pain, flushing, and diarrhea.  The patient denies the following symptoms: chest pain/pressure, dypsnea, palpitations, syncope, and pedal edema.  The patient reports exercising daily.  Adjunctive measures currently used by the patient include ASA.    Current Medications (verified): 1)  Zyrtec Hives Relief 10 Mg Tabs (Cetirizine Hcl) .Marland Kitchen.. 1 By Mouth Once Daily 2)  Aspirin 81 Mg Tabs (Aspirin) .Marland Kitchen.. 1 By Mouth At Bedtime  Allergies: 1)  ! Aciphex (Rabeprazole Sodium) 2)  ! Nexium (Esomeprazole Magnesium) 3)  ! Zegerid (Omeprazole-Sodium Bicarbonate) 4)  ! Simvastatin (Simvastatin)  Review of Systems MS:  Complains of joint pain; denies joint redness and joint swelling; Hand pain with repetitive motion activities .  Physical Exam  General:  well-nourished,in no acute distress; alert,appropriate and cooperative throughout examination Heart:  Normal rate and regular rhythm. S1 and S2 normal without gallop, murmur, click, rub. S4 Pulses:  R and L carotid,radial,dorsalis pedis and posterior tibial pulses are full and equal bilaterally Extremities:  No clubbing, cyanosis, edema, or deformity noted . Skin:  Intact without suspicious  lesions or rashes   Impression & Recommendations:  Problem # 1:  HYPERLIPIDEMIA (ICD-272.4) Genetic basis The following medications were removed from the medication list:    Simvastatin 40 Mg Tabs (Simvastatin) .Marland Kitchen... 1 at bedtime His updated medication list for this problem includes:    Crestor 20 Mg Tabs (Rosuvastatin calcium) .Marland Kitchen... 1 once daily  Problem # 2:  ARTHRALGIA (ICD-719.40)  Problem # 3:  MYALGIA (ICD-729.1) resolved off  statin His updated medication list for this problem includes:    Aspirin 81 Mg Tabs (Aspirin) .Marland Kitchen... 1 by mouth at bedtime  Complete Medication List: 1)  Zyrtec Hives Relief 10 Mg Tabs (Cetirizine hcl) .Marland Kitchen.. 1 by mouth once daily 2)  Aspirin 81 Mg Tabs (Aspirin) .Marland Kitchen.. 1 by mouth at bedtime 3)  Crestor 20 Mg Tabs (Rosuvastatin calcium) .Marland Kitchen.. 1 once daily  Patient Instructions: 1)  If having active  muscle or joint symptoms ; have CK , sed rate , & RA. 2)  Please schedule a follow-up appointment in 3 months. 3)  Hepatic Panel, CK prior to visit, ICD-9:995.20 4)  Lipid Panel prior to visit, ICD-9:272.4 Prescriptions: CRESTOR 20 MG TABS (ROSUVASTATIN CALCIUM) 1 once daily  #30 x 5   Entered and Authorized by:   Marga Melnick MD   Signed by:   Marga Melnick MD on 08/01/2010   Method used:   Print then Give to Patient   RxID:   [id number]    Orders Added: 1)  Est. Patient Level III [14782]

## 2010-09-05 NOTE — Progress Notes (Signed)
Summary: List of Adverse Reactions to Simvastatin Brought by Patient  List of Adverse Reactions to Simvastatin Brought by Patient   Imported By: Lanelle Bal 08/07/2010 14:20:13  _____________________________________________________________________  External Attachment:    Type:   Image     Comment:   External Document

## 2010-09-05 NOTE — Progress Notes (Signed)
Summary: Simvastatin alternative  Phone Note Call from Patient Call back at [phone]   Details for Reason: [facility] on [address] Summary of Call: Patient mother left message on triage that the patient would like a call to discuss muscle pain and his statin med.   I spoke with the patient and Simvastatin had been causing muscle, eye, and jaw pain. He notes that one day he bent down to pick up something and was not able to get up so he stopped the med for 5 days and symptoms have returned. His brother and mom take Crestor without any problems. Patient would like alternative sent to the pharmacy above. Please advise. Initial call taken by: Lucious Groves CMA,  July 30, 2010 2:33 PM  Follow-up for Phone Call        Per MD:  Patient notified that he needs office visit to discuss.  Patient will come on the 29th. Lucious Groves CMA  July 30, 2010 4:22 PM

## 2010-09-11 NOTE — Letter (Signed)
Summary: [facility]   Imported By: Lanelle Bal 09/02/2010 09:55:15  _____________________________________________________________________  External Attachment:    Type:   Image     Comment:   External Document

## 2010-10-10 ENCOUNTER — Other Ambulatory Visit: Payer: Self-pay | Admitting: Internal Medicine

## 2010-10-10 ENCOUNTER — Other Ambulatory Visit (INDEPENDENT_AMBULATORY_CARE_PROVIDER_SITE_OTHER): Payer: BC Managed Care – PPO

## 2010-10-10 ENCOUNTER — Encounter (INDEPENDENT_AMBULATORY_CARE_PROVIDER_SITE_OTHER): Payer: Self-pay | Admitting: *Deleted

## 2010-10-10 DIAGNOSIS — T887XXA Unspecified adverse effect of drug or medicament, initial encounter: Secondary | ICD-10-CM

## 2010-10-10 DIAGNOSIS — E785 Hyperlipidemia, unspecified: Secondary | ICD-10-CM

## 2010-10-10 LAB — LIPID PANEL
HDL: 38.2 mg/dL — ABNORMAL LOW (ref >39.00)
Triglycerides: 93 mg/dL (ref 0.0–149.0)

## 2010-10-10 LAB — HEPATIC FUNCTION PANEL
ALT: 54 U/L — ABNORMAL HIGH (ref 0–53)
Albumin: 4.7 g/dL (ref 3.5–5.2)
Total Protein: 8.1 g/dL (ref 6.0–8.3)

## 2010-11-04 ENCOUNTER — Ambulatory Visit
Admission: RE | Admit: 2010-11-04 | Discharge: 2010-11-04 | Disposition: A | Discharge status: Home / Self Care | Payer: BC Managed Care – PPO | Source: Ambulatory Visit | Attending: Pulmonary Disease | Admitting: Pulmonary Disease

## 2010-11-04 ENCOUNTER — Other Ambulatory Visit: Payer: Self-pay | Admitting: Pulmonary Disease

## 2010-11-04 DIAGNOSIS — J329 Chronic sinusitis, unspecified: Secondary | ICD-10-CM

## 2010-11-19 LAB — POCT I-STAT, CHEM 8
BUN: 9 mg/dL (ref 6–23)
Calcium, Ion: 1.16 mmol/L (ref 1.12–1.32)
Chloride: 108 mEq/L (ref 96–112)
Glucose, Bld: 114 mg/dL — ABNORMAL HIGH (ref 70–99)

## 2010-11-19 LAB — BASIC METABOLIC PANEL
CO2: 22 mEq/L (ref 19–32)
Calcium: 9.3 mg/dL (ref 8.4–10.5)
Creatinine, Ser: 1.41 mg/dL (ref 0.4–1.5)
GFR calc Af Amer: >60 mL/min (ref >60)
Glucose, Bld: 133 mg/dL — ABNORMAL HIGH (ref 70–99)

## 2010-11-19 LAB — URINALYSIS, ROUTINE W REFLEX MICROSCOPIC
Protein, ur: NEGATIVE mg/dL
Urobilinogen, UA: 0.2 mg/dL (ref 0.0–1.0)

## 2010-11-19 LAB — CBC
MCHC: 35 g/dL (ref 30.0–36.0)
RDW: 13.5 % (ref 11.5–15.5)

## 2010-11-19 LAB — POCT CARDIAC MARKERS

## 2010-12-17 NOTE — Assessment & Plan Note (Signed)
[facility]                         GASTROENTEROLOGY OFFICE NOTE   NAME:Thomas Dixon, Thomas Dixon                   MRN:          [id number]  DATE:02/26/2007                            DOB:          [birth year]    Jasman's reflux is doing well on daily Nexium but he has a lot of  abdominal gas, bloating, and cramping.  He is chewing a lot of sorbitol-  based gum which I have asked him to discontinue.  He is having no real  diarrhea or symptoms of bacterial overgrowth syndrome.  He has had no  anorexia, weight loss, or any specific hepatobiliary complaints.  He had  a normal ultrasound exam in November 2007.  Endoscopy at that time  showed a small hiatal hernia but no erosive esophagitis and biopsies of  his esophagus did show a small area of focal intestinal metaplasia  consistent with Barrett's.   The patient denies lactose intolerance or any other specific food  intolerance.  He denies systemic complaints.  He works as a Ecologist.  He does not smoke or abuse ethanol.   PHYSICAL EXAMINATION:  VITAL SIGNS:  He weighs 205 pounds and blood  pressure is 112/68, pulse was 88 and regular.  GENERAL:  Not performed.   RECOMMENDATIONS:  1. Discontinue sorbitol and fructose.  2. Switch to Zegerid 1-2 times a day as needed for his acid reflux and      Barrett's mucosa.  3. Low gas diet as tolerated.  4. Consider a trial of probiotic therapy.  5. The patient is scheduled for every 2 year endoscopy which will be      due in October 2009.  Previous biopsy of the stomach for H. pylori      was negative.     Vania Rea. Jarold Motto, MD, Caleen Essex, FAGA  Electronically Signed    DRP/MedQ  DD: 02/26/2007  DT: 02/26/2007  Job #: [id number]

## 2010-12-17 NOTE — Discharge Summary (Signed)
Thomas Dixon, Thomas Dixon            ACCOUNT NO.:  [id number]   MEDICAL RECORD NO.:  [id number]          PATIENT TYPE:  OBV   LOCATION:  3734                         FACILITY:  [facility]   PHYSICIAN:  Rosalyn Gess. Norins, MD  DATE OF BIRTH:  March 31, 1976   DATE OF ADMISSION:  03/15/2007  DATE OF DISCHARGE:                               DISCHARGE SUMMARY   ADMITTING DIAGNOSES:  1. Tachycardia  2. Nausea, vomiting.  3. Syncope.   DISCHARGE DIAGNOSES:  1. Viral gastroenteritis.  2. Dehydration with tachycardia.  3. Viral pharyngitis  4. Gastroesophageal reflux disease   CONSULTANTS:  None.   PROCEDURES:  1. Abdominal x-ray which showed the patient to have nonspecific bowel      gas type pattern.  2. Chest x-ray which was read out as unremarkable with no active      cardiopulmonary disease.   HISTORY OF PRESENT ILLNESS:  The patient is a 35 year old Caucasian  gentleman who had severe diarrhea with 12-13 bowel movements in a 2-hour  period of time on the day of admission.  He also developed nausea and  vomiting and had 2 episodes of emesis, each accompanied by syncope.  He  made a rapid recovery with no postictal symptoms.  Because of these  symptoms he was evaluated in the emergency department.  He was found to  be tachycardiac at that time and was admitted for hydration.   HOSPITAL COURSE:  The patient was admitted to a telemetry unit.  He was  observed to have a regular tachycardia.  He had cardiac enzymes that  were negative with no evidence of myocardial infarction or myocardial  strain.   The patient was hydrated vigorously.  During the course of his first  full hospital day was able take two meals without difficulty with no  nausea or vomiting.  He had one loose stool the a.m. and has had no  recurrent diarrheal stools.   DISCHARGE EXAMINATION:  The patient is afebrile at  98.5, blood pressure  was 102/56, heart rate was 88 by my exam, respirations were 20.  GENERAL  APPEARANCE:  This is a pleasant gentleman in no acute distress.  ABDOMEN:  The patient has hypoactive bowel sounds.  No guarding or  rebound tenderness were noted.  CARDIOVASCULAR:  2+ radial pulse with a rate of 88 is noted.  His  precordium was quiet.  He had a regular rate and rhythm.   FINAL LABORATORY:  Chemistries with sodium 140, potassium 3.9, chloride  110, CO2 25, BUN nine, creatinine 0.88, glucose was 109.  CBC with a  hemoglobin 13.3 grams, white count 7300, platelet count 166,000.  TSH is ordered and pending at time of discharge dictation.   DISPOSITION:  The patient is discharged home.  He will continue on  Zegerid as started by his outpatient gastroenterologist, Dr. Sheryn Bison.  The patient will follow up with Dr. Dolores Patty  his primary care physician in 7-10 days at which time his TSH can be  reviewed.   THE PATIENT'S CONDITION AT TIME OF DISCHARGE:  Stable and improved.  Rosalyn Gess Norins, MD  Electronically Signed     MEN/MEDQ  D:  03/16/2007  T:  03/17/2007  Job:  [id number]   cc:   Titus Dubin. Alwyn Ren, MD,FACP,FCCP

## 2010-12-17 NOTE — H&P (Signed)
Thomas Dixon, Thomas Dixon            ACCOUNT NO.:  [id number]   MEDICAL RECORD NO.:  [id number]          PATIENT TYPE:  OBV   LOCATION:  1845                         FACILITY:  [facility]   PHYSICIAN:  Hollice Espy, M.D.DATE OF BIRTH:  18-Oct-1975   DATE OF ADMISSION:  03/15/2007  DATE OF DISCHARGE:                              HISTORY & PHYSICAL   PRIMARY CARE PHYSICIAN:  Titus Dubin. Alwyn Ren, MD,FACP,FCCP.   CHIEF COMPLAINT:  Nausea, vomiting, diarrhea and weakness.   HISTORY OF PRESENT ILLNESS:  The patient is a 35 year old white male  with past medical history of severe reflux, who has been in relatively  good health.  He has noted a sore throat for the last couple of days and  then today he had sudden onset of some abdominal cramping and he says he  had about 15 bowel movements.  After that he had two episodes of nausea,  vomiting and then afterwards passed out bumping his head.  When he came  to he felt quite shaky.  He came into the emergency room for further  evaluation.  He was also noted to be tachycardic with his heart rate  staying anywhere from 100 on admission up to as high as 124.  IV fluids  were started on the patient.  After 4 liters he remained tachycardic  with a heart rate in the 120s to 130s even though his blood pressure had  remained stable.  The rest of his labs were ordered.  He was found to  have a normal BUN and creatinine on admission.  Negative UA.  His BUN  was slightly elevated at 24 but the rest of his labs were unremarkable.  A white count was not done on the patient but one has been ordered for  now.  The fact that the patient is still feeling quite unsteady on his  feet, and the fact that he is still tachycardic, so best to keep him in  for further evaluation.   Currently the patient feels quite exhausted.  He feels as if a truck has  run over him.  He feels a little unsteady on his feet and complains of  some leg cramping.  He does complain of a mild  headache, but no vision  changes, no dysphagia, no chest pain, palpitations, no shortness of  breath, wheeze, cough, currently no abdominal pain, no hematuria,  dysuria, constipation, diarrhea currently no diarrhea but complains of  bilateral lower extremity weakness.  His review of systems is otherwise  negative.   PAST MEDICAL HISTORY:  Reflux.   MEDICATIONS:  He is currently on Nexium.  He has been tried on other  PPIs such as Protonix and Aciphex and he said that only Nexium works for  him.   He notes no drug allergies.   He denies any tobacco or alcohol or drug use.   FAMILY HISTORY:  Noncontributory.   PHYSICAL EXAMINATION:  VITAL SIGNS:  On admission, temp 97.4, heart rate  100, now his heart rate is in the 120s to 130s, blood pressure 102/69,  respirations 20, O2 sat 97% on room air.  GENERAL:  The patient is alert and oriented x3 in no apparent distress.  HEENT:  Normocephalic atraumatic.  His mucous membranes are moist.  He  has no carotid bruits.  HEART:  Regular rhythm but tachycardic.  LUNGS:  Clear to auscultation bilaterally.  ABDOMEN:  Soft and nontender.  Few bowel sounds.  EXTREMITIES:  Show no clubbing, cyanosis, or edema.   LABORATORY:  Notes a chest x-ray showing no acute cardiopulmonary  disease.  Sodium 139, potassium 4.7, chloride 107, bicarb 26, BUN 24,  creatinine 1, glucose 123.  UA notes 15 ketones.  CPK 86.9, MB less than  1, troponin I less than 0.05.  I have ordered a repeat B-MET and a CBC  with diff.  His EKG shows a normal sinus rhythm.  No ST-T wave  abnormalities.   ASSESSMENT/PLAN:  1. Tachycardia.  Likely this may be from volume depletion.  We need to      ensure there is no other cause such as possible underlying drug      use.  This does not appear to be an abnormal cardiac rhythm and      this is an insolated incident.  We will continue to aggressively      hydrate the patient.  If his tachycardia is not improved by      tomorrow, we  will possibly consider cardiology consult if no other      etiology can be determined.  2. Leg cramping.  Likely with the addition of intravenous fluids, the      patient has depleted some potassium.  We will check a repeat B-MET      and replace accordingly.  3. Gastroesophageal reflux disease.  Continue on intravenous Protonix.  4. Nausea, vomiting, diarrhea.  Check a CBC as likely this may be a      brief episode of viral gastroenteritis.  5. Hypoactive bowel sounds.  We will check an abdominal x-ray to rule      out ileus.      Hollice Espy, M.D.  Electronically Signed     SKK/MEDQ  D:  03/15/2007  T:  03/15/2007  Job:  [id number]   cc:   Titus Dubin. Alwyn Ren, MD,FACP,FCCP

## 2010-12-20 NOTE — Assessment & Plan Note (Signed)
 HEALTHCARE                           GASTROENTEROLOGY OFFICE NOTE   NAME:Thomas Dixon, Thomas Dixon                   MRN:          [id number]  DATE:06/16/2006                            DOB:          03-06-1976    HISTORY OF PRESENT ILLNESS:  Terrelle had endoscopy performed on May 15, 2006.  He had a hiatal hernia with some irregular mucosa of the distal  esophagus.  Biopsies confirmed a short segment of Barrett's mucosa without  dysplasia.   He currently is asymptomatic on Nexium 40 mg a day but continues to have  periodic substernal pain.  I have gone ahead and set him up for gallbladder  ultrasound to complete his work up.   PLAN:  I have given him a prescription for Levsin to use 0.125 mg  sublingually p.r.n. Should he have increase in reflux symptoms, he can use  Nexium twice a day.  We had a discussion about Barrett's mucosa and his need  for endoscopy followup at two to three year intervals.  I will see him back  in three month's time or on a p.r.n. basis as needed.     Vania Rea. Jarold Motto, MD, Caleen Essex, FAGA  Electronically Signed    DRP/MedQ  DD: 06/16/2006  DT: 06/16/2006  Job #: [id number]   cc:   Titus Dubin. Alwyn Ren, MD,FACP,FCCP

## 2010-12-20 NOTE — Assessment & Plan Note (Signed)
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   Thomas Dixon has had rather typical acid reflux symptoms for the last year,  improved by daily Prilosec.  He, however, continues to have episodes of dull  aching chest pain radiating to his back and to his arm pit areas  bilaterally.  Initially he had some associated dysphagia about a year ago  but this has gone away with Prilosec therapy.  He eats two meals a day and  works the third shift at night and has somewhat irregular dietary habits.  He is a previous heavy smoker who quit in February, also at that time he was  using large doses of NSAIDs, which he has also discontinued.  He has not had  upper GI series or endoscopic exams.  He denies current dysphagia or  odynophagia.  He denies any hepatobiliary complaints.  He had a CT scan of  the abdomen done on January 20, 2006 at [facility] that was unremarkable except  for 1-2 mm right distal ureteral calculus.   The patient has found that frequent small meals seem to help his pain.  He  has gained approximately 10 pounds of weight over the last 6-7 months.  He  has had some black stools, which he attributes to eating greens.  He has  had no rectal bleeding or lower GI or hepatobiliary complaints.  He denies  any systemic complaints.   His past medical history is otherwise is unremarkable except for history of  kidney stones.   FAMILY HISTORY:  His father has Barrett's mucosa, is a patient of mine.  There is atherosclerotic cardiovascular disease in his family.   SOCIAL HISTORY:  Patient is single and lives with his parents.  He does not  smoke but is currently abusing chewing tobacco.  He  smoked 1-1/2 pack of  cigarettes a day for 10 years and quit this past February.  He uses alcohol  regularly.   REVIEW OF SYSTEMS:  Otherwise non-contributory except for some chronic  fatigue and nonspecific myalgias.  He denies specific cardiovascular,  pulmonary, or neuropsychiatric problems otherwise.   Prilosec 20 mg a day.   DRUG ALLERGIES:  He apparently in the past had a reaction to ACIPHEX.  Otherwise he denies drug allergies.   EXAMINATION:  He is a healthy appearing white male, appearing his stated age  in no distress.  He is 5 feet 9 inches tall and weighs 205 pounds.  Blood pressure is 124/80  and pulse was 80 and regular.  I could not appreciate stigmata of chronic  liver disease.  There is no thyromegaly or lymphadenopathy noted.  His chest was clear anteriorly and posteriorly.  I could not appreciate murmurs, gallops or rubs and he appeared to be in a  regular rhythm.  There is no hepatosplenomegaly,  abdominal masses or tenderness.  Bowel  sounds were normal.  Peripheral extremities were unremarkable.  Mental status was normal.  Rectal exam was deferred.   ASSESSMENT:  1. Chronic gastroesophageal reflux disease with possible Barrett's mucosa.  2. Probable large hiatal hernia which may need surgical correction.  3. History of chronic tobacco abuse.  4. History of irritable bowel syndrome with diarrhea with fatty foods.      This is not a problem at this time.   RECOMMENDATIONS:  1. Outpatient endoscopic exam at his convenience.  2. Acid reflux management and patient education movie.  3. Trial of Protonix 40 mg q. a.m. or before the first meal of the day.  4. Continue primary care followup with Dr. Alwyn Ren.                                   Vania Rea. Jarold Motto, MD, Clementeen Graham, [place]   DRP/MedQ  DD:  04/23/2006  DT:  04/24/2006  Job #:  [id number]   cc:   Titus Dubin. Alwyn Ren, MD,FACP,FCCP

## 2011-02-11 ENCOUNTER — Other Ambulatory Visit: Payer: Self-pay | Admitting: Internal Medicine

## 2011-02-11 DIAGNOSIS — T887XXA Unspecified adverse effect of drug or medicament, initial encounter: Secondary | ICD-10-CM

## 2011-02-11 DIAGNOSIS — E785 Hyperlipidemia, unspecified: Secondary | ICD-10-CM

## 2011-02-12 ENCOUNTER — Other Ambulatory Visit: Payer: Self-pay | Admitting: Internal Medicine

## 2011-02-12 ENCOUNTER — Other Ambulatory Visit (INDEPENDENT_AMBULATORY_CARE_PROVIDER_SITE_OTHER): Payer: BC Managed Care – PPO

## 2011-02-12 DIAGNOSIS — E785 Hyperlipidemia, unspecified: Secondary | ICD-10-CM

## 2011-02-12 DIAGNOSIS — T887XXA Unspecified adverse effect of drug or medicament, initial encounter: Secondary | ICD-10-CM

## 2011-02-12 LAB — HEPATIC FUNCTION PANEL
ALT: 28 U/L (ref 0–53)
AST: 21 U/L (ref 0–37)
Total Bilirubin: 0.8 mg/dL (ref 0.3–1.2)

## 2011-02-12 LAB — CK: Total CK: 77 U/L (ref 7–232)

## 2011-02-12 LAB — LIPID PANEL: VLDL: 17.2 mg/dL (ref 0.0–40.0)

## 2011-02-12 NOTE — Telephone Encounter (Signed)
Dr.Hopper please advise on refill request for Crestor, patient had labs today and LDL Direct is elevated

## 2011-02-12 NOTE — Progress Notes (Signed)
Addended by: Legrand Como on: 02/12/2011 10:13 AM   Modules accepted: Orders

## 2011-02-12 NOTE — Progress Notes (Signed)
Labs only

## 2011-02-12 NOTE — Telephone Encounter (Signed)
Lipids are not at goal; please make an appointment before refilling the medications. He should bring his  NMR Lipoprofile to that appointment. Please verify last appt ; none in EMR

## 2011-02-13 NOTE — Telephone Encounter (Signed)
We can give him some samples at the office visit; his dose of medicine will probably need to be adjusted. He needs an office visit with his  NMR for review

## 2011-03-03 ENCOUNTER — Encounter: Payer: Self-pay | Admitting: Internal Medicine

## 2011-03-04 ENCOUNTER — Ambulatory Visit (INDEPENDENT_AMBULATORY_CARE_PROVIDER_SITE_OTHER): Payer: BC Managed Care – PPO | Admitting: Internal Medicine

## 2011-03-04 ENCOUNTER — Encounter: Payer: Self-pay | Admitting: Internal Medicine

## 2011-03-04 DIAGNOSIS — E785 Hyperlipidemia, unspecified: Secondary | ICD-10-CM

## 2011-03-04 DIAGNOSIS — Z8249 Family history of ischemic heart disease and other diseases of the circulatory system: Secondary | ICD-10-CM

## 2011-03-04 MED ORDER — EZETIMIBE 10 MG PO TABS: 10.0000 mg | ORAL_TABLET | Freq: Every day | ORAL | Status: DC

## 2011-03-04 MED ORDER — ROSUVASTATIN CALCIUM 20 MG PO TABS: 20.0000 mg | ORAL_TABLET | Freq: Every day | ORAL | Status: DC

## 2011-03-04 NOTE — Progress Notes (Signed)
  Subjective:    Patient ID: Thomas Dixon, male    DOB: 11-27-1975, 35 y.o.   MRN: [id number]  HPI Dyslipidemia assessment: Prior Advanced Lipid Testing: both NMR & [facility] panels.   Family history of premature CAD/ MI: strong maternal FH premature MI/CAD .  Nutrition: no plan but moving to heart healthy .  Exercise: starting to walk . Diabetes : A1c 5.3% ( insulin level 18) . HTN: no. Smoking history  : quit 2005 .   Weight :  Weight up 153 while on steroids for protracted sinusitits. Lab results reviewed :see Problem List update. Genotype not done Meds: Crestor 20 mg daily, Co Q 10     Review of Systems ROS: fatigue: no ; chest pain : no ;claudication: no; palpitations: no; abd pain/bowel changes: LUQ occasionally @ night( no constipation / diarrhea) ; myalgias:thigh pain & arm pain;  syncope : no ; memory loss: no;skin changes: no.     Objective:   Physical Exam Gen.: Healthy and well-nourished in appearance. Alert, appropriate and cooperative throughout exam. Eyes: No corneal or conjunctival inflammation noted.  Ears: External  ear exam reveals no significant lesions or deformities. Canals clear .TMs normal. Hearing is grossly normal bilaterally. Nose: External nasal exam reveals no deformity or inflammation. Nasal mucosa are  dry. No lesions or exudates noted. Septum   Minimally deviated to R  Mouth: Oral mucosa and oropharynx reveal no lesions or exudates. Teeth in good repair. Neck: No deformities, masses, or tenderness noted.  Thyroid normal. Lungs: Normal respiratory effort; chest expands symmetrically. Lungs are clear to auscultation without rales, wheezes, or increased work of breathing. Heart: Normal rate and rhythm. Normal S1 and S2. No gallop, click, or rub. S4 w/o  murmur. Abdomen: Bowel sounds normal; abdomen soft and nontender. No masses, organomegaly or hernias noted. No AAA or bruits                                                                                 Musculoskeletal/extremities: No clubbing, cyanosis, edema, or deformity noted.  Nail health  good. Vascular: Carotid, radial artery, dorsalis pedis and  posterior tibial pulses are full and equal. No bruits present. Neurologic: Alert and oriented x3.  Skin: Intact without suspicious lesions or rashes. Lymph: No cervical, axillary lymphadenopathy present. Psych: Mood and affect are normal. 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 Advanced cholesterol testing reveals risk to be: LDL over 130, LpPLA2, cholesterol  hyperabsorption, and elevated insulin level.  #2 myalgias with normal CK  Plan: Add Zeta 10 mg daily to his Crestor. Recheck labs in 10-12 weeks.

## 2011-03-04 NOTE — Patient Instructions (Addendum)
Risk of premature heart attack or stroke increases as LDL or BAD cholesterol rises.Advanced cholesterol panels optimally determine risk base on particle composition ( NMR Lipoprofile ) or by assessing multiple other genetic risks([facility] 1304X). These are indicated when LDL is > 130, especially if there is family history of heart attack in males before 73 or women before 83  Please review Dr Gildardo Griffes book Eat, Drink & Be Healthy for dietary cholesterol information. Please  schedule fasting Labs :  Lipid Panel, CK after 3 months of dietary changes & Zetia 10 mg. Preventive Health Care: Exercise at least 30-45 minutes a day,  3-4 days a week.  Eat a low-fat diet with lots of fruits and vegetables, up to 7-9 servings per day. Avoid obesity; your goal is waist measurement < 40 inches.Consume less than 40 grams of sugar per day from foods & drinks with High Fructose Corn Sugar as #1,2,3 or # 4 on label.

## 2011-05-19 LAB — I-STAT 8, (EC8 V) (CONVERTED LAB)
Acid-base deficit: 2
BUN: 24 — ABNORMAL HIGH
Bicarbonate: 25.7 — ABNORMAL HIGH
Chloride: 107
Glucose, Bld: 123 — ABNORMAL HIGH
HCT: 50
Hemoglobin: 17
Operator id: 151321
Potassium: 4.7
Sodium: 139
TCO2: 27
pCO2, Ven: 51.6 — ABNORMAL HIGH
pH, Ven: 7.305 — ABNORMAL HIGH

## 2011-05-19 LAB — RAPID URINE DRUG SCREEN, HOSP PERFORMED
Barbiturates: NOT DETECTED
Cocaine: NOT DETECTED
Opiates: NOT DETECTED

## 2011-05-19 LAB — STREP A DNA PROBE

## 2011-05-19 LAB — BASIC METABOLIC PANEL
BUN: 14
CO2: 23
CO2: 25
Calcium: 8.5
Chloride: 109
Creatinine, Ser: 0.87
Creatinine, Ser: 0.88
GFR calc Af Amer: >60
GFR calc non Af Amer: >60
Glucose, Bld: 108 — ABNORMAL HIGH
Potassium: 3.9
Sodium: 140

## 2011-05-19 LAB — POCT CARDIAC MARKERS
CKMB, poc: <1 — ABNORMAL LOW
Myoglobin, poc: 86.9
Operator id: 151321
Troponin i, poc: <0.05

## 2011-05-19 LAB — DIFFERENTIAL
Basophils Relative: 0
Eosinophils Absolute: 0
Eosinophils Relative: 0
Monocytes Relative: 4
Neutrophils Relative %: 88 — ABNORMAL HIGH

## 2011-05-19 LAB — CBC
HCT: 38.8 — ABNORMAL LOW
MCHC: 34.2
MCV: 87.8
Platelets: 166

## 2011-05-19 LAB — POCT I-STAT CREATININE
Creatinine, Ser: 1
Operator id: 151321

## 2011-05-19 LAB — URINALYSIS, ROUTINE W REFLEX MICROSCOPIC
Nitrite: NEGATIVE
Specific Gravity, Urine: 1.03
pH: 6.5

## 2011-05-19 LAB — RAPID STREP SCREEN (MED CTR MEBANE ONLY): Streptococcus, Group A Screen (Direct): NEGATIVE

## 2011-06-12 ENCOUNTER — Other Ambulatory Visit: Payer: Self-pay | Admitting: Internal Medicine

## 2011-06-12 DIAGNOSIS — E785 Hyperlipidemia, unspecified: Secondary | ICD-10-CM

## 2011-06-12 DIAGNOSIS — Z8249 Family history of ischemic heart disease and other diseases of the circulatory system: Secondary | ICD-10-CM

## 2011-06-13 ENCOUNTER — Other Ambulatory Visit (INDEPENDENT_AMBULATORY_CARE_PROVIDER_SITE_OTHER): Payer: BC Managed Care – PPO

## 2011-06-13 DIAGNOSIS — Z8249 Family history of ischemic heart disease and other diseases of the circulatory system: Secondary | ICD-10-CM

## 2011-06-13 DIAGNOSIS — E785 Hyperlipidemia, unspecified: Secondary | ICD-10-CM

## 2011-06-13 LAB — LIPID PANEL: Cholesterol: 267 mg/dL — ABNORMAL HIGH (ref 0–200)

## 2011-06-13 LAB — LDL CHOLESTEROL, DIRECT: Direct LDL: 243.4 mg/dL

## 2011-06-13 NOTE — Progress Notes (Signed)
12  

## 2011-06-17 ENCOUNTER — Telehealth: Payer: Self-pay

## 2011-06-17 NOTE — Telephone Encounter (Signed)
Message copied by Edgardo Roys on Tue Jun 17, 2011  5:10 PM ------      Message from: Pecola Lawless      Created: Sun Jun 15, 2011  5:09 PM       Risk of premature heart attack or stroke increases as LDL or BAD cholesterol rises.Advanced cholesterol panels optimally determine risk based on particle composition ( NMR Lipoprofile ) or by assessing multiple other genetic risks([facility] Heart Panel 1304X). These are indicated when LDL is > 130, especially if there is family history of heart attack in males before 81 or women before 47. Your LDL goal = < 130.Please see me before refilling meds. Hopp      Please see if your brother is willing to share his cholesterol results with you.

## 2011-06-17 NOTE — Telephone Encounter (Signed)
Spoke with patient's wife, informed her of patient's lab results and offered to schedule appointment to further address. Mrs. Hipple indicated her husband will have to call back to schedule his own appointment due to his work schedule. Copy of report mailed

## 2011-09-01 ENCOUNTER — Encounter (HOSPITAL_COMMUNITY): Payer: Self-pay

## 2011-09-01 DIAGNOSIS — E785 Hyperlipidemia, unspecified: Secondary | ICD-10-CM | POA: Diagnosis present

## 2011-09-01 NOTE — H&P (Signed)
HOPI: Patient presents as a sick work-in  for follow-up/evaluation and treatment of chest pain.  Chest pain started 2 weeks  ago. It is described as pressure to sharp and started when he pulled a heavy box. Since the has had some discomfort, but last two days he has had chest pressure with exertion with radiation to the back and both hands feel tingly. Chest pain is present with or without activity. Frequency is about 1-2  per day. Worsening  yes and is associated with dyspnea. He also states his whole body hurts. It is located in the middle of chest region. It did radiate to the back and  arm. tingling. Chest discomfort lasts minutes to hours. Chest discomfort is associated with  dyspnea; No PND,  Orthopnea;  Diaphoresis;  Palpitations;  Nausea;  vomiting,  Dizziness or near syncope.      ROS: Arthritis NO, life-style limiting NA;  Cramping of the legs and feet at night or with activity NO.     But has been hurting in his limbs since being on statins; Diabetes NO, Controlled NA;  Hypothyroidism NO;  Previous Stroke NO, Previous GI Bleed NO ,  Recent weight change NO.     Other systems negative.       Known allergies: Aciphex (rash) and Simvastatin (itchiness/muslce pain and weakness).       Past Medical History (Major events, hospitalizations, surgeries):  None.       Ongoing medical problems: Hyperlipidemia; Barrett's Disease.       Family medical history: Father is 53 yrs.     old-Barrett's Disease.     Mother is 55 yrs.     CAD and stents at age 14;  old Hypertension; Hyperlipidemia.     1 brother-Hyperlipidemia.       Social history: No smoking.     No alcohol.     Married.     1 child.  ALLERGIES:       Severe Simvastatin oral tablet allergy resulting in Itchiness (skin)      Mild AcipHex (RABEprazole) oral enteric coated tablet allergy resulting in Rash - generalized (skin)   Physical Exam: Afebrile, 70/min, regular, 14/min RR, BP 90/40 mm Hg.  GENERAL: Well built and obese body habitus,  appears stated  age.   No acute distress noted.     EYES: PERRLA, EOM's full, conjunctivae clear.   Nose: Mucosa normal, no obstruction.   THROAT: Clear, no exudates, no lesions.   NECK: Supple, no masses, no thyromegaly, no JVD.     CHEST: Lungs clear, no rales, no rhonchi, no wheezes.     HEART: S1S2 normal,  no murmurs, no rubs, no gallops.     ABDOMEN: Soft, no tenderness, no masses, BS normal.     BACK: Normal curvature, no tenderness.   EXTREMITIES: Full range of motion, no deformities, no edema, no erythema.     NEURO: Alert, oriented x 3,  no localizing findings.   SKIN: Normal, no rashes, no lesions noted.   No ulcerations or signs of limb ischemia.     Vascular exam: Normal carotid upstroke.   No bruit.   Abdominal aortic palpation appears to be normal without prominend pulsation.   no bruit.   Femoral and pedal pulses are normal.   Assessment:   Patient presents as a sick work-in  for follow-up/evaluation and treatment of chest pain.  Chest pain started 2 weeks  ago. It is described as pressure to sharp and started when he pulled a  heavy box. Since the has had some discomfort, but last two days he has had chest pressure with exertion with radiation to the back and both hands feel tingly.  1. Atypical chest pain cannot completely exclude angina pectoris.  EKG 09/01/11: NSR @ 79/min. Normal axis, Normal intervals. no ischemia. No change 06/2011 EKG: NSR, Normal intervals, no ischemia.   2. Familial Mixed Hyperlipidemia.   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 Patient recently has been having worsening myalgias with Crestor.  He would like to change to a different agent if possible.  He has stopped taking Crestor for the last 3 days but has been compliant in the past. 3. Family h/o premature CAD,   Mother has   CAD and stents at age 74 years.   Plan:    Some of the symptoms of generalized malaise and myalgias not feeling well could also be related to a viral prodrome.  However he does have significant risk factors and markedly abnormal lipids.  I have taken him off of work for now.  I will start him on PPI pantoprazole 40 mg once a day before meals, aspirin 81 mg by mouth daily and given prescription for nitroglycerin sublingual for when necessary use.Mechanism of underlying disease process and action of medications discussed with the patient.     Schedule for cardiac catheterization. We discussed regarding risks, benefits, alternatives to this including stress testing, CTA and continued medical therapy. Patient wants to proceed. Understands <1-2% risk of death, stroke, MI, urgent CABG, bleeding, infection, renal failure but not limited to these.    I will obtain CBC, CMP, Lipid panel, TSH, HbA1c, Vit D  All labs. CPK for rhabdomyalysis  although patient denies any dark urine, severe pain in the muscles or muscle tenderness was not evident on physical examination.  Wife is present at the bedside. Patient instructed to call if symptoms worse or to go to the ED for further evaluation.  REFERRALS:       Marga Melnick (Internal Medicine)  Fax: [phone]  Phone: [phone]  MEDICATIONS:

## 2011-09-02 ENCOUNTER — Ambulatory Visit (HOSPITAL_COMMUNITY)
Admission: RE | Admit: 2011-09-02 | Discharge: 2011-09-02 | Disposition: A | Discharge status: Home / Self Care | Payer: BC Managed Care – PPO | Source: Ambulatory Visit | Attending: Cardiology | Admitting: Cardiology

## 2011-09-02 ENCOUNTER — Encounter (HOSPITAL_COMMUNITY)
Admission: RE | Disposition: A | Discharge status: Home / Self Care | Payer: Self-pay | Source: Ambulatory Visit | Attending: Cardiology

## 2011-09-02 DIAGNOSIS — R0609 Other forms of dyspnea: Secondary | ICD-10-CM | POA: Insufficient documentation

## 2011-09-02 DIAGNOSIS — E782 Mixed hyperlipidemia: Secondary | ICD-10-CM | POA: Insufficient documentation

## 2011-09-02 DIAGNOSIS — R0989 Other specified symptoms and signs involving the circulatory and respiratory systems: Secondary | ICD-10-CM | POA: Insufficient documentation

## 2011-09-02 DIAGNOSIS — Z8249 Family history of ischemic heart disease and other diseases of the circulatory system: Secondary | ICD-10-CM | POA: Insufficient documentation

## 2011-09-02 DIAGNOSIS — I2541 Coronary artery aneurysm: Secondary | ICD-10-CM | POA: Insufficient documentation

## 2011-09-02 DIAGNOSIS — E785 Hyperlipidemia, unspecified: Secondary | ICD-10-CM | POA: Diagnosis present

## 2011-09-02 HISTORY — PX: LEFT HEART CATHETERIZATION WITH CORONARY ANGIOGRAM: SHX5451

## 2011-09-02 LAB — CBC
Hemoglobin: 15.2 g/dL (ref 13.0–17.0)
Platelets: 162 10*3/uL (ref 150–400)
RBC: 5.1 MIL/uL (ref 4.22–5.81)
WBC: 5.7 10*3/uL (ref 4.0–10.5)

## 2011-09-02 LAB — LIPID PANEL
Cholesterol: 218 mg/dL — ABNORMAL HIGH (ref 0–200)
LDL Cholesterol: 166 mg/dL — ABNORMAL HIGH (ref 0–99)
Total CHOL/HDL Ratio: 5.9 RATIO
Triglycerides: 77 mg/dL (ref <150)
VLDL: 15 mg/dL (ref 0–40)

## 2011-09-02 LAB — COMPREHENSIVE METABOLIC PANEL
ALT: 23 U/L (ref 0–53)
AST: 18 U/L (ref 0–37)
Albumin: 4 g/dL (ref 3.5–5.2)
CO2: 25 mEq/L (ref 19–32)
Chloride: 105 mEq/L (ref 96–112)
GFR calc non Af Amer: >90 mL/min (ref >90)
Sodium: 140 mEq/L (ref 135–145)
Total Bilirubin: 0.7 mg/dL (ref 0.3–1.2)

## 2011-09-02 LAB — PROTIME-INR
INR: 1.11 (ref 0.00–1.49)
Prothrombin Time: 14.5 seconds (ref 11.6–15.2)

## 2011-09-02 LAB — HEMOGLOBIN A1C: Mean Plasma Glucose: 111 mg/dL (ref <117)

## 2011-09-02 LAB — CK: Total CK: 76 U/L (ref 7–232)

## 2011-09-02 SURGERY — LEFT HEART CATHETERIZATION WITH CORONARY ANGIOGRAM
Anesthesia: LOCAL

## 2011-09-02 MED ORDER — SODIUM CHLORIDE 0.9 % IV SOLN: INTRAVENOUS | Status: AC

## 2011-09-02 MED ORDER — HEPARIN (PORCINE) IN NACL 2-0.9 UNIT/ML-% IJ SOLN
INTRAMUSCULAR | Status: AC
  Filled 2011-09-02: qty 2000

## 2011-09-02 MED ORDER — ACETAMINOPHEN 325 MG PO TABS: 650.0000 mg | ORAL_TABLET | ORAL | Status: DC | PRN

## 2011-09-02 MED ORDER — HYDROMORPHONE HCL PF 2 MG/ML IJ SOLN
INTRAMUSCULAR | Status: AC
  Filled 2011-09-02: qty 1

## 2011-09-02 MED ORDER — SODIUM CHLORIDE 0.9 % IJ SOLN: 3.0000 mL | INTRAMUSCULAR | Status: DC | PRN

## 2011-09-02 MED ORDER — LIDOCAINE HCL (PF) 1 % IJ SOLN
INTRAMUSCULAR | Status: AC
  Filled 2011-09-02: qty 30

## 2011-09-02 MED ORDER — SODIUM CHLORIDE 0.9 % IV SOLN: 250.0000 mL | INTRAVENOUS | Status: DC | PRN

## 2011-09-02 MED ORDER — ASPIRIN 81 MG PO CHEW
324.0000 mg | CHEWABLE_TABLET | ORAL | Status: AC
  Administered 2011-09-02: 324 mg via ORAL
  Filled 2011-09-02: qty 4

## 2011-09-02 MED ORDER — MIDAZOLAM HCL 2 MG/2ML IJ SOLN
INTRAMUSCULAR | Status: AC
  Filled 2011-09-02: qty 2

## 2011-09-02 MED ORDER — NITROGLYCERIN 0.2 MG/ML ON CALL CATH LAB
INTRAVENOUS | Status: AC
  Filled 2011-09-02: qty 1

## 2011-09-02 MED ORDER — VERAPAMIL HCL 2.5 MG/ML IV SOLN
INTRAVENOUS | Status: AC
  Filled 2011-09-02: qty 2

## 2011-09-02 MED ORDER — CLOPIDOGREL BISULFATE 75 MG PO TABS: 75.0000 mg | ORAL_TABLET | Freq: Every day | ORAL | Status: DC

## 2011-09-02 MED ORDER — SODIUM CHLORIDE 0.9 % IJ SOLN: 3.0000 mL | Freq: Two times a day (BID) | INTRAMUSCULAR | Status: DC

## 2011-09-02 MED ORDER — ONDANSETRON HCL 4 MG/2ML IJ SOLN
INTRAMUSCULAR | Status: AC
  Filled 2011-09-02: qty 2

## 2011-09-02 MED ORDER — HEPARIN SODIUM (PORCINE) 1000 UNIT/ML IJ SOLN
INTRAMUSCULAR | Status: AC
  Filled 2011-09-02: qty 1

## 2011-09-02 MED ORDER — SODIUM CHLORIDE 0.9 % IV SOLN
INTRAVENOUS | Status: DC
  Administered 2011-09-02: 11:00:00 via INTRAVENOUS

## 2011-09-02 MED ORDER — ONDANSETRON HCL 4 MG/2ML IJ SOLN
4.0000 mg | Freq: Four times a day (QID) | INTRAMUSCULAR | Status: DC | PRN
  Administered 2011-09-02: 4 mg via INTRAVENOUS

## 2011-09-02 NOTE — Progress Notes (Signed)
C/O NAUSEA AND DR Jacinto Halim NOTIFIED AND PER DR Jacinto Halim CLIENT COULD HAVE ZOFRAN 4MG  PO AND TOLD CLIENT AND HE STATES HE DOES NOT WANT ANYTHING FOR NAUSEA

## 2011-09-02 NOTE — Interval H&P Note (Signed)
History and Physical Interval Note:  09/02/2011 3:33 PM  Thomas Dixon  has presented today for surgery, with the diagnosis of c/p  The various methods of treatment have been discussed with the patient and family. After consideration of risks, benefits and other options for treatment, the patient has consented to  Procedure(s): LEFT HEART CATHETERIZATION WITH CORONARY ANGIOGRAM as a surgical intervention .  The patients' history has been reviewed, patient examined, no change in status, stable for surgery.  I have reviewed the patients' chart and labs.  Questions were answered to the patient's satisfaction.     Tashika Goodin,JAGADEESH R  No change in HPI since last OV note

## 2011-09-02 NOTE — Op Note (Signed)
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 Normal LVEF.

## 2011-09-02 NOTE — Progress Notes (Signed)
C/O NAUSEA,MED GIVEN

## 2011-09-03 LAB — VITAMIN D 25 HYDROXY (VIT D DEFICIENCY, FRACTURES): Vit D, 25-Hydroxy: 49 ng/mL (ref 30–89)

## 2011-09-03 NOTE — Cardiovascular Report (Signed)
NAMEBRAYDEN, Dixon            ACCOUNT NO.:  [id number]  MEDICAL RECORD NO.:  [id number]  LOCATION:  [facility]                         FACILITY:  [facility]  PHYSICIAN:  Pamella Pert, MD DATE OF BIRTH:  10/01/1975  DATE OF PROCEDURE: DATE OF DISCHARGE:  09/02/2011                           CARDIAC CATHETERIZATION   REFERRING PHYSICIAN:  Titus Dixon. Thomas Ren, MD, FACP, FCCP  PROCEDURE PERFORMED:  Left heart catheterization including 1. Left ventriculography. 2. Selective right and left coronary arteriography.  INDICATION:  Mr. Thomas Dixon is a very pleasant 36 year old gentleman with family history of premature coronary artery disease and severe familial hyperlipidemia with markedly elevated combined hyperlipidemia.  He has been doing well until 2 weeks ago, had developed chest discomfort which was very concerning for angina pectoris.  I saw him in the office yesterday and decided to proceed with cardiac catheterization to evaluate his coronary anatomy given his classic history.  Presently, I have taken him off of work for a week.  HEMODYNAMIC DATA:  The left ventricular pressure was 98/11 with end- diastolic pressure of 13 mmHg.  Aortic pressure was 100/74 with a mean of 85 mmHg.  There was no pressure gradient across the aortic valve.  ANGIOGRAPHIC DATA:  Left ventricle:  Left ventricular systolic function was normal with an ejection fraction of 55-60%.  There was no regional wall motion abnormality.  Right coronary artery:  Right coronary artery is a large caliber-vessel and a dominant vessel.  It is severely aneurysmal in the proximal mid and mid-to-distal segment.  Diffuse luminal irregularity was evident in the right coronary artery.  There was swirling of the contrast noted. The flow improved with intracoronary nitroglycerin administration.  Left main coronary artery:  Left main coronary artery is a large-caliber vessel.  It is smooth and normal.  Circumflex  coronary artery:  Circumflex coronary artery shows mild ectasia in the proximal segment versus an ulceration.  There was no contrast hang up.  I suspect this was probably be an ectasia than a true ulcerated plaque.  The circumflex shows mild luminal irregularity.  Lad:  LAD is a very large-caliber vessel.  Again, there is aneurysmal dilatation noted in the proximal and mid-segment of the LAD giving a sausage-shaped appearance.  From the proximal segment, there is a small diagonal 1, and from the mid-segment where it normalizes, there is origin of a large diagonal 2 and small diagonal 3.  LAD ends before reaching the apex.  IMPRESSION: 1. Aneurysmal dilatation of the coronary arteries involving especially     the proximal and mid left anterior descending artery, and the     entire proximal mid-to-distal segment of the right coronary artery. 2. Mild ectasia in the ostial circumflex coronary artery. 3. Normal left ventricular systolic function.  RECOMMENDATION:  The patient's chest discomfort could be related to embolic complications from this aneurysmal dilatation.  He will be continued on statin therapy.  I will start the patient on Plavix along with aspirin.  I will see him back in the office to make further recommendations.  TECHNIQUE OF THE PROCEDURE:  Under sterile precaution using a 6-French right radial arterial access, a 6-French TIG #4 catheter was advanced into the ascending aorta,  then into the left ventricle.  Left ventriculography was performed in the RAO projection.  The same catheter was utilized to engage the left main coronary artery and angiography was performed.  The catheter was then pulled out of the body.  The right coronary artery was selectively cannulated using a 5-French JR4 diagnostic catheter.  Intracoronary nitroglycerin was administered both in the left main and right coronary artery.  After angiography was performed, the catheter was pulled out of the  body.  Catheter exchanges were done over a safety J-wire.  Hemostasis was obtained by applying TR band.  The patient tolerated the procedure well.  No immediate complication.     Pamella Pert, MD     JRG/MEDQ  D:  09/02/2011  T:  09/03/2011  Job:  [id number]  cc:   Thomas Dixon. Thomas Ren, MD,FACP,FCCP

## 2011-09-10 ENCOUNTER — Ambulatory Visit
Admission: RE | Admit: 2011-09-10 | Discharge: 2011-09-10 | Disposition: A | Discharge status: Home / Self Care | Payer: BC Managed Care – PPO | Source: Ambulatory Visit | Attending: Cardiology | Admitting: Cardiology

## 2011-09-10 ENCOUNTER — Other Ambulatory Visit: Payer: Self-pay | Admitting: Cardiology

## 2011-09-10 DIAGNOSIS — I729 Aneurysm of unspecified site: Secondary | ICD-10-CM

## 2011-09-12 ENCOUNTER — Other Ambulatory Visit: Payer: BC Managed Care – PPO

## 2011-09-22 ENCOUNTER — Other Ambulatory Visit: Payer: Self-pay | Admitting: Internal Medicine

## 2011-09-22 NOTE — Telephone Encounter (Signed)
Patient needs to schedule a follow-up.

## 2011-11-06 ENCOUNTER — Emergency Department (HOSPITAL_COMMUNITY)
Admission: EM | Admit: 2011-11-06 | Discharge: 2011-11-06 | Disposition: A | Discharge status: Home / Self Care | Payer: BC Managed Care – PPO | Attending: Emergency Medicine | Admitting: Emergency Medicine

## 2011-11-06 ENCOUNTER — Encounter (HOSPITAL_COMMUNITY): Payer: Self-pay | Admitting: *Deleted

## 2011-11-06 DIAGNOSIS — K219 Gastro-esophageal reflux disease without esophagitis: Secondary | ICD-10-CM | POA: Insufficient documentation

## 2011-11-06 DIAGNOSIS — R300 Dysuria: Secondary | ICD-10-CM | POA: Insufficient documentation

## 2011-11-06 DIAGNOSIS — R109 Unspecified abdominal pain: Secondary | ICD-10-CM | POA: Insufficient documentation

## 2011-11-06 DIAGNOSIS — R259 Unspecified abnormal involuntary movements: Secondary | ICD-10-CM | POA: Insufficient documentation

## 2011-11-06 DIAGNOSIS — Z7982 Long term (current) use of aspirin: Secondary | ICD-10-CM | POA: Insufficient documentation

## 2011-11-06 DIAGNOSIS — E785 Hyperlipidemia, unspecified: Secondary | ICD-10-CM | POA: Insufficient documentation

## 2011-11-06 DIAGNOSIS — Z79899 Other long term (current) drug therapy: Secondary | ICD-10-CM | POA: Insufficient documentation

## 2011-11-06 DIAGNOSIS — K589 Irritable bowel syndrome without diarrhea: Secondary | ICD-10-CM | POA: Insufficient documentation

## 2011-11-06 DIAGNOSIS — K2270 Barrett's esophagus without dysplasia: Secondary | ICD-10-CM | POA: Insufficient documentation

## 2011-11-06 DIAGNOSIS — R110 Nausea: Secondary | ICD-10-CM | POA: Insufficient documentation

## 2011-11-06 LAB — COMPREHENSIVE METABOLIC PANEL
BUN: 15 mg/dL (ref 6–23)
CO2: 26 mEq/L (ref 19–32)
Calcium: 9.7 mg/dL (ref 8.4–10.5)
Creatinine, Ser: 0.98 mg/dL (ref 0.50–1.35)
GFR calc Af Amer: >90 mL/min (ref >90)
GFR calc non Af Amer: >90 mL/min (ref >90)
Glucose, Bld: 97 mg/dL (ref 70–99)
Sodium: 138 mEq/L (ref 135–145)
Total Protein: 7.3 g/dL (ref 6.0–8.3)

## 2011-11-06 LAB — URINALYSIS, ROUTINE W REFLEX MICROSCOPIC
Bilirubin Urine: NEGATIVE
Hgb urine dipstick: NEGATIVE
Ketones, ur: NEGATIVE mg/dL
Nitrite: NEGATIVE
Specific Gravity, Urine: 1.013 (ref 1.005–1.030)
pH: 7.5 (ref 5.0–8.0)

## 2011-11-06 LAB — CBC
Hemoglobin: 15.7 g/dL (ref 13.0–17.0)
MCH: 30.6 pg (ref 26.0–34.0)
MCHC: 34.3 g/dL (ref 30.0–36.0)
MCV: 89.3 fL (ref 78.0–100.0)
RBC: 5.13 MIL/uL (ref 4.22–5.81)

## 2011-11-06 MED ORDER — HYDROCODONE-ACETAMINOPHEN 5-325 MG PO TABS: 2.0000 | ORAL_TABLET | ORAL | Status: AC | PRN

## 2011-11-06 MED ORDER — SODIUM CHLORIDE 0.9 % IV BOLUS (SEPSIS)
1000.0000 mL | Freq: Once | INTRAVENOUS | Status: AC
  Administered 2011-11-06: 1000 mL via INTRAVENOUS

## 2011-11-06 MED ORDER — ONDANSETRON HCL 4 MG PO TABS: 4.0000 mg | ORAL_TABLET | Freq: Four times a day (QID) | ORAL | Status: AC

## 2011-11-06 NOTE — ED Notes (Signed)
Patient states that he passed a kidney stone yesterday and started having trimmers x one week and the trimmers have increased x 3 day. Patient states that when he starts with a trimmer if he eats or drinks something the trimmer will usually go away. Patient resting at this time and is not experiencing trimmers. Family at bedside.

## 2011-11-06 NOTE — Discharge Instructions (Signed)
Abdominal Pain  Abdominal pain can be caused by many things. Your caregiver decides the seriousness of your pain by an examination and possibly blood tests and X-rays. Many cases can be observed and treated at home. Most abdominal pain is not caused by a disease and will probably improve without treatment. However, in many cases, more time must pass before a clear cause of the pain can be found. Before that point, it may not be known if you need more testing, or if hospitalization or surgery is needed.  HOME CARE INSTRUCTIONS   · Do not take laxatives unless directed by your caregiver.  · Take pain medicine only as directed by your caregiver.  · Only take over-the-counter or prescription medicines for pain, discomfort, or fever as directed by your caregiver.  · Try a clear liquid diet (broth, tea, or water) for as long as directed by your caregiver. Slowly move to a bland diet as tolerated.  SEEK IMMEDIATE MEDICAL CARE IF:   · The pain does not go away.  · You have a fever.  · You keep throwing up (vomiting).  · The pain is felt only in portions of the abdomen. Pain in the right side could possibly be appendicitis. In an adult, pain in the left lower portion of the abdomen could be colitis or diverticulitis.  · You pass bloody or black tarry stools.  MAKE SURE YOU:   · Understand these instructions.  · Will watch your condition.  · Will get help right away if you are not doing well or get worse.  Document Released: 04/30/2005 Document Revised: 07/10/2011 Document Reviewed: 03/08/2008  ExitCare® Patient Information ©2012 [employer].  Kidney Stones-?  Kidney stones (ureteral lithiasis) are deposits that form inside your kidneys. The intense pain is caused by the stone moving through the urinary tract. When the stone moves, the ureter goes into spasm around the stone. The stone is usually passed in the urine.   CAUSES   · A disorder that makes certain neck glands produce too much parathyroid hormone (primary  hyperparathyroidism).  · A buildup of uric acid crystals.  · Narrowing (stricture) of the ureter.  · A kidney obstruction present at birth (congenital obstruction).  · Previous surgery on the kidney or ureters.  · Numerous kidney infections.  SYMPTOMS   · Feeling sick to your stomach (nauseous).  · Throwing up (vomiting).  · Blood in the urine (hematuria).  · Pain that usually spreads (radiates) to the groin.  · Frequency or urgency of urination.  DIAGNOSIS   · Taking a history and physical exam.  · Blood or urine tests.  · Computerized X-ray scan (CT scan).  · Occasionally, an examination of the inside of the urinary bladder (cystoscopy) is performed.  TREATMENT   · Observation.  · Increasing your fluid intake.  · Surgery may be needed if you have severe pain or persistent obstruction.  The size, location, and chemical composition are all important variables that will determine the proper choice of action for you. Talk to your caregiver to better understand your situation so that you will minimize the risk of injury to yourself and your kidney.   HOME CARE INSTRUCTIONS   · Drink enough water and fluids to keep your urine clear or pale yellow.  · Strain all urine through the provided strainer. Keep all particulate matter and stones for your caregiver to see. The stone causing the pain may be as small as a grain of salt. It is   very important to use the strainer each and every time you pass your urine. The collection of your stone will allow your caregiver to analyze it and verify that a stone has actually passed.  · Only take over-the-counter or prescription medicines for pain, discomfort, or fever as directed by your caregiver.  · Make a follow-up appointment with your caregiver as directed.  · Get follow-up X-rays if required. The absence of pain does not always mean that the stone has passed. It may have only stopped moving. If the urine remains completely obstructed, it can cause loss of kidney function or even  complete destruction of the kidney. It is your responsibility to make sure X-rays and follow-ups are completed. Ultrasounds of the kidney can show blockages and the status of the kidney. Ultrasounds are not associated with any radiation and can be performed easily in a matter of minutes.  SEEK IMMEDIATE MEDICAL CARE IF:   · Pain cannot be controlled with the prescribed medicine.  · You have a fever.  · The severity or intensity of pain increases over 18 hours and is not relieved by pain medicine.  · You develop a new onset of abdominal pain.  · You feel faint or pass out.  MAKE SURE YOU:   · Understand these instructions.  · Will watch your condition.  · Will get help right away if you are not doing well or get worse.  Document Released: 07/21/2005 Document Revised: 07/10/2011 Document Reviewed: 11/16/2009  ExitCare® Patient Information ©2012 [employer].

## 2011-11-06 NOTE — ED Provider Notes (Signed)
Medical screening examination/treatment/procedure(s) were performed by non-physician practitioner and as supervising physician I was immediately available for consultation/collaboration.   Carleene Cooper III, MD 11/06/11 2025

## 2011-11-06 NOTE — ED Notes (Signed)
Pt reports started feeling bad last night, reports tremors chills and nausea. Pt reports hx of kidney stones, passed one stone on Wednesday and reports he has another stone. Reports intermittent moderate discomfort to left flank radiating into abdomen. Pt reports no pain at this time. No shaking noted on exam of pt.

## 2011-11-06 NOTE — ED Provider Notes (Signed)
History     CSN: [id number]  Arrival date & time 11/06/11  8295   First MD Initiated Contact with Patient 11/06/11 [phone]      Chief Complaint  Patient presents with  . Chills  . Nausea  . Tremors    (Consider location/radiation/quality/duration/timing/severity/associated sxs/prior treatment) HPI  Pt presents to the ED with complaints of having passed a stone yesterday. Pt states that he has passed 35 stones in his life time. He states that this one took one week to get from the kidney to the bladder than then another week to get from the bladder and urinated out. The patient states that he had tremors yesterday as well as nausea. The reason he came to the ER today is because he still has suprapubic tenderness and pain during urination and is concerned that he may have a UTI. He denies having fevers, V/D, SOB, wheezing  Past Medical History  Diagnosis Date  . GERD (gastroesophageal reflux disease)   . IBS (irritable bowel syndrome)   . Gastritis   . Barrett's esophagus   . Hiatal hernia   . Hyperlipidemia   . Renal disorder     kidney stones    History reviewed. No pertinent past surgical history.  History reviewed. No pertinent family history.  History  Substance Use Topics  . Smoking status: Former Games developer  . Smokeless tobacco: Not on file   Comment: 7 years ago as of 2012  . Alcohol Use: Yes      Review of Systems  All other systems reviewed and are negative.    Allergies  Esomeprazole magnesium; Omeprazole-sodium bicarbonate; Simvastatin; and Rabeprazole sodium  Home Medications   Current Outpatient Rx  Name Route Sig Dispense Refill  . ASPIRIN EC 81 MG PO TBEC Oral Take 81 mg by mouth daily.    . ATORVASTATIN CALCIUM 40 MG PO TABS Oral Take 40 mg by mouth daily.    . AZELASTINE HCL 137 MCG/SPRAY NA SOLN Nasal Place 1 spray into the nose 2 (two) times daily as needed. Use in each nostril as directed For allergies    . CETIRIZINE HCL 10 MG PO TABS Oral  Take 10 mg by mouth daily as needed. For seasonal allergies    . CLOPIDOGREL BISULFATE 75 MG PO TABS Oral Take 1 tablet (75 mg total) by mouth daily. 30 tablet 1  . PANTOPRAZOLE SODIUM 40 MG PO TBEC Oral Take 40 mg by mouth daily.    Marland Kitchen HYDROCODONE-ACETAMINOPHEN 5-325 MG PO TABS Oral Take 2 tablets by mouth every 4 (four) hours as needed for pain. 12 tablet 0  . NITROGLYCERIN 0.4 MG SL SUBL Sublingual Place 0.4 mg under the tongue every 5 (five) minutes as needed. For chest pain    . ONDANSETRON HCL 4 MG PO TABS Oral Take 1 tablet (4 mg total) by mouth every 6 (six) hours. 12 tablet 0    BP 118/79  Pulse 80  Temp(Src) 97.6 F (36.4 C) (Oral)  Resp 18  SpO2 98%  Physical Exam  Nursing note and vitals reviewed. Constitutional: He appears well-developed and well-nourished. No distress.  HENT:  Head: Normocephalic and atraumatic.  Eyes: Pupils are equal, round, and reactive to light.  Neck: Normal range of motion. Neck supple.  Cardiovascular: Normal rate and regular rhythm.   Pulmonary/Chest: Effort normal.  Abdominal: Soft. Bowel sounds are normal. He exhibits no distension and no mass. There is no tenderness. There is no rebound and no guarding.  Neurological: He is alert.  Skin: Skin is warm and dry.    ED Course  Procedures (including critical care time)   Labs Reviewed  URINALYSIS, ROUTINE W REFLEX MICROSCOPIC  CBC  COMPREHENSIVE METABOLIC PANEL   No results found.   1. Abdominal pain       MDM   PT declines pain medication in the ED as he says he has not been in any pain today. Pt l;labs UA/CMP/Lipase/CBC have come back unremarkable. Patient is not experiencing any pain today and already passed stone. His urine shows no infection. Pt can folllow-up with PCP within 1 week. Given Vicodin and Zofran.  Pt has been advised of the symptoms that warrant their return to the ED. Patient has voiced understanding and has agreed to follow-up with the PCP or  specialist.     Dorthula Matas, PA 11/06/11 1222

## 2011-11-12 ENCOUNTER — Telehealth: Payer: Self-pay | Admitting: Gastroenterology

## 2011-11-12 NOTE — Telephone Encounter (Signed)
Pt had EGD per chart on 07/15/2006 showing HH, GERD, Gastritis and GERD. Sent RECALL letter 04/2008, but no response. Pt's mom reports d/t pt's job he would like tests and OV done on the same day. Pt thinks his S&S are his gall bladder. Explained to pt's mom that he can be seen and perhaps if warrented , an U/S may be scheduled in the same day. She will ask pt and call back.

## 2011-11-12 NOTE — Telephone Encounter (Signed)
Pt hasn't been seen since 02/26/2007; last EGD 11/17/2007. Hx of IBS, GERD, HH , Barrett's . Pt has a problem of chest pain that radiates to his back- same problem as in 2008. He saw his Urologist 2 weeks ago and passed a stone afterward; he has a hx of stones. Recently he has had cardiac clearance d/t cardiac cath that was clear. He was placed on protonix, plavix and lipitor. Pt went to ER 11/06/11 d/t tremors, nausea and chills and was instructed to stop the protonix. Stopping the protonix has help with that, but now he's having nausea and the pain and he thinks it may be his gallbladder. 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 Offered pt an appt with a mid level tomorrow or Friday and she will call back. She asked if tests were done could they be ordered the same day as the OV and I stated now and the mid level appt wil be at 1:30 and 2pm.

## 2011-11-12 NOTE — Telephone Encounter (Signed)
Pt agreed to be seen 1st thing 11/19/11 with Mike Gip, PA; mom will inform pt of the appt.

## 2011-11-19 ENCOUNTER — Encounter: Payer: Self-pay | Admitting: Gastroenterology

## 2011-11-19 ENCOUNTER — Encounter: Payer: Self-pay | Admitting: Physician Assistant

## 2011-11-19 ENCOUNTER — Ambulatory Visit (INDEPENDENT_AMBULATORY_CARE_PROVIDER_SITE_OTHER): Payer: BC Managed Care – PPO | Admitting: Physician Assistant

## 2011-11-19 VITALS — BP 120/79 | HR 92 | Ht 69.0 in | Wt 215.6 lb

## 2011-11-19 DIAGNOSIS — I2541 Coronary artery aneurysm: Secondary | ICD-10-CM | POA: Insufficient documentation

## 2011-11-19 DIAGNOSIS — R1013 Epigastric pain: Secondary | ICD-10-CM

## 2011-11-19 DIAGNOSIS — R1011 Right upper quadrant pain: Secondary | ICD-10-CM

## 2011-11-19 DIAGNOSIS — K2270 Barrett's esophagus without dysplasia: Secondary | ICD-10-CM

## 2011-11-19 MED ORDER — ESOMEPRAZOLE MAGNESIUM 40 MG PO CPDR: 40.0000 mg | DELAYED_RELEASE_CAPSULE | Freq: Every day | ORAL | Status: DC

## 2011-11-19 MED ORDER — ONDANSETRON 4 MG PO TBDP: ORAL_TABLET | ORAL | Status: DC

## 2011-11-19 NOTE — Progress Notes (Signed)
Reviewed and agree with management. Fredrich Cory T. Ozie Dimaria MD FACG 

## 2011-11-19 NOTE — Patient Instructions (Signed)
We sent prescriptions for Nexium and Zofran for nausea to [facility] [address]. We scheduled the Endoscopy with Dr. Jarold Motto for 11-25-2011. Directions provided. We scheduled the CT scan for Friday 11-21-2011.  You have been scheduled for a CT scan of the abdomen and pelvis at [facility] ([address]---this is in the same building as Architectural technologist).   You are scheduled on 11-21-2011 at 8:30 AM  You should arrive at 8:15 AM to your appointment time for registration. Please follow the written instructions below on the day of your exam:  WARNING: IF YOU ARE ALLERGIC TO IODINE/X-RAY DYE, PLEASE NOTIFY RADIOLOGY IMMEDIATELY AT [phone]! YOU WILL BE GIVEN A 13 HOUR PREMEDICATION PREP.  1) Do not eat or drink anything after 4:30 AM  (4 hours prior to your test) 2) You have been given 2 bottles of oral contrast to drink. The solution may taste better if refrigerated, but do NOT add ice or any other liquid to this solution. Shake well before drinking.    Drink 1 bottle of contrast @ 6:30 AM  (2 hours prior to your exam)  Drink 1 bottle of contrast @ 7:30 AM (1 hour prior to your exam)  You may take any medications as prescribed with a small amount of water except for the following: Metformin, Glucophage, Glucovance, Avandamet, Riomet, Fortamet, Actoplus Met, Janumet, Glumetza or Metaglip. The above medications must be held the day of the exam AND 48 hours after the exam.  The purpose of you drinking the oral contrast is to aid in the visualization of your intestinal tract. The contrast solution may cause some diarrhea. Before your exam is started, you will be given a small amount of fluid to drink. Depending on your individual set of symptoms, you may also receive an intravenous injection of x-ray contrast/dye. Plan on being at [facility] for 30 minutes or long, depending on the type of exam you are having performed.  If you have any questions regarding your exam or if  you need to reschedule, you may call the CT department at [phone] between the hours of 8:00 am and 5:00 pm, Monday-Friday.  ________________________________________________________________________

## 2011-11-19 NOTE — Progress Notes (Signed)
Subjective:    Patient ID: Thomas Dixon, male    DOB: 06-28-1976, 36 y.o.   MRN: [id number]  HPI Thomas Dixon is a 36 year old white male known previously to Dr. Jarold Motto from upper endoscopy done in 2007 at which time he was found to have a 4 cm prolapsing hiatal hernia and Barrett's esophagus. He was on treated with PPI therapy but has not been seen since then. He was to have followup in 2 years.  Patient has had problems with multiple kidney stones and recently underwent cardiac cath  because of complaints of chest pain. This was done in January of 2013 per Dr. 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 He says he has not had any further chest pain.  He was also started on Lipitor at that time and says that since he started a new cardiac meds he had some episodes of shaking with tremor-like movements that he ever had in the past. He has since gone off of the Lipitor and feels that his symptoms are better but he also stopped his Protonix at that time because he wasn't sure which medication was causing problems. Now over the past 2 months he says he has been having problems with epigastric and right upper quadrant pain which has been occurring almost every night. This started after he lies down at night can be associated with what sounds like sour brash nausea time since it is so sharp that he takes his breath away. Says he is often unable to find a comfortable position at night. During the day he has had some increase in reflux symptoms. He denies any dysphagia or odynophagia. His bowel movements have been regular though he has had some constipation over the past few days and occasionally sees a small amount of red blood from a hemorrhoid.  He has not had any fever or chills has been fine and his weight has been stable. Over the past 3 days he has had an increase in  nausea and says that everything that he eats seems to make him nauseated and generally he just does not feel good.  Patient had an ER visit on 11/11/2011 after he had passed a kidney stone labs done at that time were unremarkable.   Review of Systems  Constitutional: Positive for appetite change.  HENT: Negative.   Eyes: Negative.   Respiratory: Negative.   Cardiovascular: Negative.   Gastrointestinal: Positive for nausea, abdominal pain and constipation.  Genitourinary: Negative.   Musculoskeletal: Negative.   Neurological: Positive for tremors. Negative for syncope.  Hematological: Negative.   Psychiatric/Behavioral: Negative.    Outpatient Prescriptions Prior to Visit  Medication Sig Dispense Refill  . aspirin EC 81 MG tablet Take 81 mg by mouth daily.      Marland Kitchen azelastine (ASTEPRO) 137 MCG/SPRAY nasal spray Place 1 spray into the nose 2 (two) times daily as needed. Use in each nostril as directed For allergies      . cetirizine (ZYRTEC) 10 MG tablet Take 10 mg by mouth daily as needed. For seasonal allergies      . clopidogrel (PLAVIX) 75 MG tablet Take 1 tablet (75 mg total) by mouth daily.  30 tablet  1  . nitroGLYCERIN (NITROSTAT) 0.4 MG SL tablet Place 0.4 mg under the tongue every 5 (five) minutes as needed. For chest pain      .  atorvastatin (LIPITOR) 40 MG tablet Take 40 mg by mouth daily.      . pantoprazole (PROTONIX) 40 MG tablet Take 40 mg by mouth daily.       Allergies  Allergen Reactions  . Esomeprazole Magnesium     REACTION: gas  . Omeprazole-Sodium Bicarbonate     REACTION: diarrhea  . Simvastatin     REACTION: muscle aches, eye symptoms, fatigue  . Rabeprazole Sodium Rash    "aciphex"    Patient Active Problem List  Diagnoses  . HYPERLIPIDEMIA  . RHINITIS  . GERD  . BARRETTS ESOPHAGUS  . GASTROENTERITIS  . IBS  . POLYARTHRITIS  . MYALGIA  . DIZZINESS  . FATIGUE  . MUSCLE FASCICULATIONS  . JAW PAIN  . CHEST PAIN  . ATTENTION OR CONCENTRATION  DEFICIT  . ARTHRALGIA  . Hyperlipidemia  . Aneurysm of coronary vessels       Objective:   Physical Exam well-developed white male in no acute distress blood pressure 120/79 pulse 92. HEENT; nontender , normocephalic EOMI PERRLA sclera anicteric,neck; Supple no JVD, Cardiovascular; regular rate and rhythm with S1-S2 no murmur gallop, Pulmonary; clear bilaterally, Abdomen ;soft he is tender in the epigastrium and right upper quadrant there is no palpable mass or hepatosplenomegaly felt ,bowel sounds are present no tenderness to palpation over the left anterior ribs, Rectal; not done, Extremities; no clubbing cyanosis or edema skin warm dry, psych; mood and affect appropriate        Assessment & Plan:  #36 36 year old male with 2 month history of epigastric and right upper quadrant pain radiating around into his back occurring primarily at night and associated with nausea and sour brash. Now symptoms progressive with more constant nausea x3 days. Patient has been on aspirin and Plavix over the past 3 months, will need to rule out peptic ulcer disease, gallbladder disease or other intra-abdominal inflammatory process. 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 Rule out intra-abdominal vasculopathy. #3 chronic GERD #4 history of Barrett's esophagus over due for followup EGD  Plan;#1 restart PPI therapy, he will start Nexium 40 mg by mouth every morning. He was advised to take his Plavix already from Nexium dosing. #2 check CBC and hepatic panel today #3 schedule for upper endoscopy with Dr. Jarold Motto, patient will remain on Plavix and aspirin #4 for CT scan of the abdomen and pelvis. #5 Zofran ODT 4 mg q. 8 hours as needed for nausea Further workup pending results of above

## 2011-11-21 ENCOUNTER — Ambulatory Visit (INDEPENDENT_AMBULATORY_CARE_PROVIDER_SITE_OTHER)
Admission: RE | Admit: 2011-11-21 | Discharge: 2011-11-21 | Disposition: A | Discharge status: Home / Self Care | Payer: BC Managed Care – PPO | Source: Ambulatory Visit | Attending: Physician Assistant | Admitting: Physician Assistant

## 2011-11-21 DIAGNOSIS — R1013 Epigastric pain: Secondary | ICD-10-CM

## 2011-11-21 DIAGNOSIS — R1011 Right upper quadrant pain: Secondary | ICD-10-CM

## 2011-11-21 MED ORDER — IOHEXOL 300 MG/ML  SOLN
100.0000 mL | Freq: Once | INTRAMUSCULAR | Status: AC | PRN
  Administered 2011-11-21: 100 mL via INTRAVENOUS

## 2011-11-24 ENCOUNTER — Telehealth: Payer: Self-pay | Admitting: *Deleted

## 2011-11-24 NOTE — Telephone Encounter (Signed)
I called the patient to apologize for not telling him to go to the lab on the 17th when he saw Amy Esterwood PA-C.  He asked if he could come to our lab about 8:30 Am before he has to be on the 4th floor for his EGD with Dr. Jarold Motto.  I told him that would be fine but let him know he has to be on the 4th floor to sign in by 9:00 AM.

## 2011-11-25 ENCOUNTER — Ambulatory Visit (AMBULATORY_SURGERY_CENTER): Payer: BC Managed Care – PPO | Admitting: Gastroenterology

## 2011-11-25 ENCOUNTER — Encounter: Payer: Self-pay | Admitting: Gastroenterology

## 2011-11-25 ENCOUNTER — Other Ambulatory Visit (INDEPENDENT_AMBULATORY_CARE_PROVIDER_SITE_OTHER): Payer: BC Managed Care – PPO

## 2011-11-25 VITALS — BP 101/67 | HR 77 | Temp 97.9°F | Resp 16 | Ht 69.0 in | Wt 215.0 lb

## 2011-11-25 DIAGNOSIS — K2270 Barrett's esophagus without dysplasia: Secondary | ICD-10-CM

## 2011-11-25 DIAGNOSIS — R1013 Epigastric pain: Secondary | ICD-10-CM

## 2011-11-25 DIAGNOSIS — D130 Benign neoplasm of esophagus: Secondary | ICD-10-CM

## 2011-11-25 DIAGNOSIS — R1011 Right upper quadrant pain: Secondary | ICD-10-CM

## 2011-11-25 DIAGNOSIS — K219 Gastro-esophageal reflux disease without esophagitis: Secondary | ICD-10-CM

## 2011-11-25 LAB — CBC WITH DIFFERENTIAL/PLATELET
Basophils Absolute: 0 10*3/uL (ref 0.0–0.1)
Eosinophils Relative: 2.2 % (ref 0.0–5.0)
Lymphocytes Relative: 42.2 % (ref 12.0–46.0)
Monocytes Relative: 7.8 % (ref 3.0–12.0)
Platelets: 163 10*3/uL (ref 150.0–400.0)
RDW: 12.9 % (ref 11.5–14.6)
WBC: 5.9 10*3/uL (ref 4.5–10.5)

## 2011-11-25 LAB — HEPATIC FUNCTION PANEL
AST: 16 U/L (ref 0–37)
Alkaline Phosphatase: 57 U/L (ref 39–117)
Bilirubin, Direct: 0 mg/dL (ref 0.0–0.3)
Total Bilirubin: 0.5 mg/dL (ref 0.3–1.2)

## 2011-11-25 MED ORDER — SODIUM CHLORIDE 0.9 % IV SOLN: 500.0000 mL | INTRAVENOUS | Status: DC

## 2011-11-25 NOTE — Patient Instructions (Signed)
Per Dr. Jarold Motto continue acid reducing medication.  Handout given on GERD to your care partner.  Please resume your prior medications today.     YOU HAD AN ENDOSCOPIC PROCEDURE TODAY AT THE [facility]: Refer to the procedure report that was given to you for any specific questions about what was found during the examination.  If the procedure report does not answer your questions, please call your gastroenterologist to clarify.  If you requested that your care partner not be given the details of your procedure findings, then the procedure report has been included in a sealed envelope for you to review at your convenience later.  YOU SHOULD EXPECT: Some feelings of bloating in the abdomen. Passage of more gas than usual.  Walking can help get rid of the air that was put into your GI tract during the procedure and reduce the bloating. If you had a lower endoscopy (such as a colonoscopy or flexible sigmoidoscopy) you may notice spotting of blood in your stool or on the toilet paper. If you underwent a bowel prep for your procedure, then you may not have a normal bowel movement for a few days.  DIET: Your first meal following the procedure should be a light meal and then it is ok to progress to your normal diet.  A half-sandwich or bowl of soup is an example of a good first meal.  Heavy or fried foods are harder to digest and may make you feel nauseous or bloated.  Likewise meals heavy in dairy and vegetables can cause extra gas to form and this can also increase the bloating.  Drink plenty of fluids but you should avoid alcoholic beverages for 24 hours.  ACTIVITY: Your care partner should take you home directly after the procedure.  You should plan to take it easy, moving slowly for the rest of the day.  You can resume normal activity the day after the procedure however you should NOT DRIVE or use heavy machinery for 24 hours (because of the sedation medicines used during the test).     SYMPTOMS TO REPORT IMMEDIATELY: A gastroenterologist can be reached at any hour.  During normal business hours, 8:30 AM to 5:00 PM Monday through Friday, call [phone].  After hours and on weekends, please call the GI answering service at [phone] who will take a message and have the physician on call contact you.       Following upper endoscopy (EGD)  Vomiting of blood or coffee ground material  New chest pain or pain under the shoulder blades  Painful or persistently difficult swallowing  New shortness of breath  Fever of 100F or higher  Black, tarry-looking stools  FOLLOW UP: If any biopsies were taken you will be contacted by phone or by letter within the next 1-3 weeks.  Call your gastroenterologist if you have not heard about the biopsies in 3 weeks.  Our staff will call the home number listed on your records the next business day following your procedure to check on you and address any questions or concerns that you may have at that time regarding the information given to you following your procedure. This is a courtesy call and so if there is no answer at the home number and we have not heard from you through the emergency physician on call, we will assume that you have returned to your regular daily activities without incident.  SIGNATURES/CONFIDENTIALITY: You and/or your care partner have signed paperwork which will be entered  into your electronic medical record.  These signatures attest to the fact that that the information above on your After Visit Summary has been reviewed and is understood.  Full responsibility of the confidentiality of this discharge information lies with you and/or your care-partner.

## 2011-11-25 NOTE — Progress Notes (Signed)
PROPOFOL PER S CAMP CRNA. MEDS TITRATED PER CRNA AND MD. SEE SCANNED INTRA PROCEDURE REPORT. EWM 

## 2011-11-25 NOTE — Progress Notes (Signed)
No complaints noted in the recovery room. Maw  Patient did not experience any of the following events: a burn prior to discharge; a fall within the facility; wrong site/side/patient/procedure/implant event; or a hospital transfer or hospital admission upon discharge from the facility. (G8907) Patient did not have preoperative order for IV antibiotic SSI prophylaxis. (G8918)  

## 2011-11-25 NOTE — Op Note (Signed)
[facility] [address] [place], [place]  [address]  ENDOSCOPY PROCEDURE REPORT  PATIENT:  Thomas, Dixon  MR#:  [id number] BIRTHDATE:  1976-01-26, 35 yrs. old  GENDER:  male  ENDOSCOPIST:  Vania Rea. Jarold Motto, MD, [facility] Referred by:  Venita Lick. Russella Dar, M.D., [facility]  PROCEDURE DATE:  11/25/2011 PROCEDURE:  EGD with biopsy, 43239, EGD with biopsy for H. pylori 19147 ASA CLASS:  Class II INDICATIONS:  GERD, chest pain  MEDICATIONS:   propofol (Diprivan) 200 mg IV TOPICAL ANESTHETIC:  DESCRIPTION OF PROCEDURE:   After the risks and benefits of the procedure were explained, informed consent was obtained.  The LB GIF-H180 [id number] endoscope was introduced through the mouth and advanced to the second portion of the duodenum.  The instrument was slowly withdrawn as the mucosa was fully examined. <<PROCEDUREIMAGES>>  irregular Z-line. ?? BARRETT'S BIOPSIED,NO EROSIONS  Normal duodenal folds were noted.  Otherwise normal stomach. CLO BX. DONE.    Retroflexed views revealed no abnormalities.    The scope was then withdrawn from the patient and the procedure completed. COMPLICATIONS:  None  ENDOSCOPIC IMPRESSION: 1) Irregular Z-line 2) Normal duodenal folds 3) Otherwise normal stomach 1.CHRONIC GERD.ON PPI RX. 2.R/O BARRETT'MUCOSA RECOMMENDATIONS: 1) Await pathology results 2) Rx CLO if positive 3) continue PPI  ______________________________ Vania Rea. Jarold Motto, MD, [facility]  CC:  Pecola Lawless, MD  n. Rosalie DoctorMarland Kitchen   Vania Rea. Olayinka Gathers at 11/25/2011 10:08 AM  Bezek, Tinnie Gens, [phone]

## 2011-11-26 ENCOUNTER — Telehealth: Payer: Self-pay | Admitting: *Deleted

## 2011-11-26 NOTE — Telephone Encounter (Signed)
  Follow up Call-  Call back number 11/25/2011  Post procedure Call Back phone  # [phone]  Permission to leave phone message Yes     Patient questions:  Do you have a fever, pain , or abdominal swelling? no Pain Score  0 *  Have you tolerated food without any problems? yes  Have you been able to return to your normal activities? yes  Do you have any questions about your discharge instructions: Diet   no Medications  no Follow up visit  no  Do you have questions or concerns about your Care? no  Actions: * If pain score is 4 or above: No action needed, pain <4.

## 2011-11-27 ENCOUNTER — Encounter: Payer: Self-pay | Admitting: Gastroenterology

## 2011-12-01 ENCOUNTER — Encounter: Payer: Self-pay | Admitting: Gastroenterology

## 2011-12-12 ENCOUNTER — Ambulatory Visit: Payer: BC Managed Care – PPO | Admitting: Gastroenterology

## 2011-12-14 ENCOUNTER — Emergency Department (HOSPITAL_COMMUNITY)
Admission: EM | Admit: 2011-12-14 | Discharge: 2011-12-14 | Disposition: A | Discharge status: Home / Self Care | Payer: BC Managed Care – PPO | Attending: Emergency Medicine | Admitting: Emergency Medicine

## 2011-12-14 ENCOUNTER — Encounter (HOSPITAL_COMMUNITY): Payer: Self-pay

## 2011-12-14 ENCOUNTER — Other Ambulatory Visit: Payer: Self-pay

## 2011-12-14 ENCOUNTER — Emergency Department (HOSPITAL_COMMUNITY): Payer: BC Managed Care – PPO

## 2011-12-14 DIAGNOSIS — R110 Nausea: Secondary | ICD-10-CM | POA: Insufficient documentation

## 2011-12-14 DIAGNOSIS — R079 Chest pain, unspecified: Secondary | ICD-10-CM | POA: Insufficient documentation

## 2011-12-14 DIAGNOSIS — I2541 Coronary artery aneurysm: Secondary | ICD-10-CM | POA: Insufficient documentation

## 2011-12-14 DIAGNOSIS — R42 Dizziness and giddiness: Secondary | ICD-10-CM | POA: Insufficient documentation

## 2011-12-14 DIAGNOSIS — R61 Generalized hyperhidrosis: Secondary | ICD-10-CM | POA: Insufficient documentation

## 2011-12-14 DIAGNOSIS — E785 Hyperlipidemia, unspecified: Secondary | ICD-10-CM | POA: Insufficient documentation

## 2011-12-14 DIAGNOSIS — K219 Gastro-esophageal reflux disease without esophagitis: Secondary | ICD-10-CM | POA: Insufficient documentation

## 2011-12-14 LAB — HEPATIC FUNCTION PANEL
ALT: 21 U/L (ref 0–53)
AST: 21 U/L (ref 0–37)
Albumin: 4 g/dL (ref 3.5–5.2)
Alkaline Phosphatase: 65 U/L (ref 39–117)
Bilirubin, Direct: <0.1 mg/dL (ref 0.0–0.3)
Total Bilirubin: 0.2 mg/dL — ABNORMAL LOW (ref 0.3–1.2)
Total Protein: 7.4 g/dL (ref 6.0–8.3)

## 2011-12-14 LAB — CBC
HCT: 44.7 % (ref 39.0–52.0)
Hemoglobin: 15.3 g/dL (ref 13.0–17.0)
MCH: 30.3 pg (ref 26.0–34.0)
MCHC: 34.2 g/dL (ref 30.0–36.0)
MCV: 88.5 fL (ref 78.0–100.0)

## 2011-12-14 LAB — POCT I-STAT TROPONIN I: Troponin i, poc: 0 ng/mL (ref 0.00–0.08)

## 2011-12-14 LAB — BASIC METABOLIC PANEL
BUN: 17 mg/dL (ref 6–23)
Creatinine, Ser: 0.91 mg/dL (ref 0.50–1.35)
GFR calc non Af Amer: >90 mL/min (ref >90)
Glucose, Bld: 88 mg/dL (ref 70–99)
Potassium: 4.3 mEq/L (ref 3.5–5.1)

## 2011-12-14 NOTE — Discharge Instructions (Signed)
As we discussed, your cardiac markers and other labs today were normal. Your EKG was normal, as was your chest x-ray. You should stay home from work tomorrow and instead call Dr Jacinto Halim and see him in the office. Otherwise, please continue your GI follow-up for the right-sided pain as previously planned.       Chest Pain (Nonspecific) It is often hard to give a specific diagnosis for the cause of chest pain. There is always a chance that your pain could be related to something serious, such as a heart attack or a blood clot in the lungs. You need to follow up with your caregiver for further evaluation. CAUSES   Heartburn.   Pneumonia or bronchitis.   Anxiety or stress.   Inflammation around your heart (pericarditis) or lung (pleuritis or pleurisy).   A blood clot in the lung.   A collapsed lung (pneumothorax). It can develop suddenly on its own (spontaneous pneumothorax) or from injury (trauma) to the chest.   Shingles infection (herpes zoster virus).  The chest wall is composed of bones, muscles, and cartilage. Any of these can be the source of the pain.  The bones can be bruised by injury.   The muscles or cartilage can be strained by coughing or overwork.   The cartilage can be affected by inflammation and become sore (costochondritis).  DIAGNOSIS  Lab tests or other studies, such as X-rays, electrocardiography, stress testing, or cardiac imaging, may be needed to find the cause of your pain.  TREATMENT   Treatment depends on what may be causing your chest pain. Treatment may include:   Acid blockers for heartburn.   Anti-inflammatory medicine.   Pain medicine for inflammatory conditions.   Antibiotics if an infection is present.   You may be advised to change lifestyle habits. This includes stopping smoking and avoiding alcohol, caffeine, and chocolate.   You may be advised to keep your head raised (elevated) when sleeping. This reduces the chance of acid going  backward from your stomach into your esophagus.   Most of the time, nonspecific chest pain will improve within 2 to 3 days with rest and mild pain medicine.  HOME CARE INSTRUCTIONS   If antibiotics were prescribed, take your antibiotics as directed. Finish them even if you start to feel better.   For the next few days, avoid physical activities that bring on chest pain. Continue physical activities as directed.   Do not smoke.   Avoid drinking alcohol.   Only take over-the-counter or prescription medicine for pain, discomfort, or fever as directed by your caregiver.   Follow your caregiver's suggestions for further testing if your chest pain does not go away.   Keep any follow-up appointments you made. If you do not go to an appointment, you could develop lasting (chronic) problems with pain. If there is any problem keeping an appointment, you must call to reschedule.  SEEK MEDICAL CARE IF:   You think you are having problems from the medicine you are taking. Read your medicine instructions carefully.   Your chest pain does not go away, even after treatment.   You develop a rash with blisters on your chest.  SEEK IMMEDIATE MEDICAL CARE IF:   You have increased chest pain or pain that spreads to your arm, neck, jaw, back, or abdomen.   You develop shortness of breath, an increasing cough, or you are coughing up blood.   You have severe back or abdominal pain, feel nauseous, or vomit.  You develop severe weakness, fainting, or chills.   You have a fever.  THIS IS AN EMERGENCY. Do not wait to see if the pain will go away. Get medical help at once. Call your local emergency services (911 in [place] Do not drive yourself to the hospital. MAKE SURE YOU:   Understand these instructions.   Will watch your condition.   Will get help right away if you are not doing well or get worse.  Document Released: 04/30/2005 Document Revised: 07/10/2011 Document Reviewed: 02/24/2008 [facility]  Patient Information 2012 [place], [place].

## 2011-12-14 NOTE — ED Notes (Signed)
Pt. Was sitting in church and began having lt. Side chest pain that radiates into his rt. Chest.  Constant, pounding,  Described as his heart is racing.  Pt. Reports being sob, dizzy and nauseated, Skin is p/w/d.  Resp. E/u

## 2011-12-14 NOTE — ED Provider Notes (Signed)
History     CSN: [id number]  Arrival date & time 12/14/11  1145   First MD Initiated Contact with Patient 12/14/11 1152      Chief Complaint  Patient presents with  . Chest Pain    (Consider location/radiation/quality/duration/timing/severity/associated sxs/prior treatment) The history is provided by the patient and the spouse.   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 Jacinto Halim presents the emergency department with a chief complaint of chest pain. Pain began at rest this morning while he was sitting in church, described as a pressure to the left chest followed by a full body sensation of warmth and associated with lightheadedness as if he were going to pass out. There was diaphoresis and associated nausea (the patient reports he is chronically nauseated and plans to see a GI doctor in the near future regarding this). Denies that there was any assoc SOB. The symptoms lasted for approximately 20-30 minutes and resolved spontaneously there are followed by pain in the right side of the chest with tingling to the right arm and jaw. On arrival to the ER, the only remaining symptom is pain to the right inferior anterior and lateral chest. He denies any recent fever, chills, cough, or congestion. Take daily 81mg  ASA and 75mg  Plavix per cardiology recommendation. Risk factors described above include HTN, HLD, known aneurysmal coronary vessels. FH HLD, early CAD in mother. Recent procedures include cardiac cath as previously mentioned and EGD in April without any concerning findings.   Past Medical History  Diagnosis Date  . GERD (gastroesophageal reflux disease)   . IBS (irritable bowel syndrome)   . Gastritis   . Barrett's esophagus   . Hiatal hernia   . Hyperlipidemia   . Renal disorder     kidney stones    Past Surgical History    Procedure Date  . Cardiac catheterization     Family History  Problem Relation Age of Onset  . Colon polyps Father   . Heart disease Mother   . Heart disease Brother   . Heart disease Maternal Uncle     multiple  . Heart disease Maternal Aunt     multiple  . Heart disease Maternal Grandmother   . Pancreatic cancer Paternal Grandfather     History  Substance Use Topics  . Smoking status: Former Smoker    Quit date: 11/19/2003  . Smokeless tobacco: Never Used   Comment: 7 years ago as of 2012  . Alcohol Use: No      Review of Systems  Gastrointestinal:       Irregular bowel movements for the last few weeks with color variation from green-light tan-dark brown. Has addressed with GI and has further plans for work-up.  10 systems reviewed and are otherwise negative for acute change except as noted in the HPI.   Allergies  Esomeprazole magnesium; Omeprazole-sodium bicarbonate; Pantoprazole sodium; Simvastatin; and Rabeprazole sodium  Home Medications   Current Outpatient Rx  Name Route Sig Dispense Refill  . ASPIRIN EC 81 MG PO TBEC Oral Take 81 mg by mouth daily.    Marland Kitchen CETIRIZINE HCL 10 MG PO TABS Oral Take 10 mg by mouth daily as needed. For seasonal allergies    . CLOPIDOGREL BISULFATE 75 MG PO TABS Oral Take 1 tablet (75 mg total) by mouth daily. 30 tablet 1  . ESOMEPRAZOLE MAGNESIUM 40 MG  PO CPDR Oral Take 40 mg by mouth daily before breakfast.    . NITROGLYCERIN 0.4 MG SL SUBL Sublingual Place 0.4 mg under the tongue every 5 (five) minutes as needed. For chest pain      BP 104/58  Pulse 79  Temp(Src) 98.2 F (36.8 C) (Oral)  Resp 16  SpO2 98%  Physical Exam  Nursing note and vitals reviewed. Constitutional: He appears well-developed and well-nourished. No distress.  HENT:  Head: Normocephalic and atraumatic.  Right Ear: External ear normal.  Left Ear: External ear normal.  Mouth/Throat: Oropharynx is clear and moist.  Eyes: Pupils are equal, round, and  reactive to light.  Neck: Normal range of motion. Neck supple.  Cardiovascular: Normal rate, regular rhythm, normal heart sounds and intact distal pulses.   No murmur heard. Pulmonary/Chest: Effort normal and breath sounds normal. No respiratory distress. He has no wheezes. He has no rales. He exhibits no tenderness.  Abdominal: Soft. Bowel sounds are normal. He exhibits no distension. There is no tenderness.  Musculoskeletal: He exhibits no edema and no tenderness.       Calves supple and nontender  Neurological: He is alert.       MS appears appropriate for pt and situation  Skin: Skin is warm and dry.    ED Course  Procedures (including critical care time)  Labs Reviewed  HEPATIC FUNCTION PANEL - Abnormal; Notable for the following:    Total Bilirubin 0.2 (*)    All other components within normal limits  CBC  BASIC METABOLIC PANEL  POCT I-STAT TROPONIN I  LIPASE, BLOOD   Dg Chest 2 View  12/14/2011  *RADIOLOGY REPORT*  Clinical Data: Chest pain  CHEST - 2 VIEW  Comparison: Chest radiograph 03/15/2007  Findings: Normal mediastinum and cardiac silhouette.  Normal pulmonary  vasculature.  No evidence of effusion, infiltrate, or pneumothorax.  No acute bony abnormality.  IMPRESSION: No acute cardiopulmonary process.  Original Report Authenticated By: Genevive Bi, M.D.    Date: 12/14/2011  Rate: 90  Rhythm: normal sinus rhythm  QRS Axis: normal  Intervals: normal  ST/T Wave abnormalities: normal  Conduction Disutrbances: none  Old EKG Reviewed: prior dated 03/15/07, No significant changes noted      MDM  CP in patient with known coronary abnormalities. EKG unremarkable. Continues with mild right-sided pain in ED that is not reproducible. No abd tenderness on exam to suggest biliary pathology for pain, but lipase and hepatic function panel are checked as pt is concerned this may be contributory.   Labs are reviewed. CBC, BMP, Hepatic function panel, lipase, initial  troponin nml/negative. CXR unremarkable. I spoke with Dr Jacinto Halim, who advises to get a second cardiac marker and if negative, pt can be d/c home to f/u in office with him tomorrow.      4:40 PM PA Kirichenko has received report on this patient and will f/u on second troponin and dispo accordingly.  Shaaron Adler, PA-C 12/14/11 1640

## 2011-12-15 ENCOUNTER — Ambulatory Visit (INDEPENDENT_AMBULATORY_CARE_PROVIDER_SITE_OTHER): Payer: BC Managed Care – PPO | Admitting: Physician Assistant

## 2011-12-15 ENCOUNTER — Encounter: Payer: Self-pay | Admitting: Physician Assistant

## 2011-12-15 ENCOUNTER — Telehealth: Payer: Self-pay | Admitting: Gastroenterology

## 2011-12-15 VITALS — BP 112/60 | HR 84 | Ht 69.0 in | Wt 216.4 lb

## 2011-12-15 DIAGNOSIS — R0789 Other chest pain: Secondary | ICD-10-CM

## 2011-12-15 DIAGNOSIS — R071 Chest pain on breathing: Secondary | ICD-10-CM

## 2011-12-15 DIAGNOSIS — M546 Pain in thoracic spine: Secondary | ICD-10-CM

## 2011-12-15 DIAGNOSIS — R1011 Right upper quadrant pain: Secondary | ICD-10-CM

## 2011-12-15 DIAGNOSIS — M549 Dorsalgia, unspecified: Secondary | ICD-10-CM

## 2011-12-15 DIAGNOSIS — R110 Nausea: Secondary | ICD-10-CM

## 2011-12-15 MED ORDER — ONDANSETRON HCL 8 MG PO TABS: ORAL_TABLET | ORAL | Status: DC

## 2011-12-15 NOTE — Patient Instructions (Signed)
We sent a prescription for Zofran 8 mg to  [facility] [address]  We scheduled the Ultrasound for tomorrow 12-16-2011. Arrive at 8:15 AM. Have nothing to eat or drink after midnight.  We also scheduled the Hidascan, ( Gallbladder function test) for 12-25-2011.  Arrive at 7:45 Am.  Have nothing to eat or drink After midnight.   Continue the Nexium once daily.

## 2011-12-15 NOTE — Telephone Encounter (Signed)
Pt's mom called to report pt is so sick; went to the ER yesterday. He is having the chest pain in his R breast. The pain is not constant, but he's having these attacks often. 11/25/11 EGD with chronic gastritis, - H.P. He went to his Cardiologist this am and was cleared. Pt saw Mike Gip, PA on 11/19/11; CT showed renal non obstructing calculi. Pt had an appt on 12/12/11 with Dr Jarold Motto that he had to cancel d/t work. Amy will see the pt today at 3pm; mom informed and instructed to have a release of information completed to add his mom; mom stated understanding.

## 2011-12-15 NOTE — ED Provider Notes (Signed)
Medical screening examination/treatment/procedure(s) were performed by non-physician practitioner and as supervising physician I was immediately available for consultation/collaboration.   Kali Ambler, MD 12/15/11 1454 

## 2011-12-16 ENCOUNTER — Encounter: Payer: Self-pay | Admitting: Physician Assistant

## 2011-12-16 ENCOUNTER — Ambulatory Visit (HOSPITAL_COMMUNITY)
Admission: RE | Admit: 2011-12-16 | Discharge: 2011-12-16 | Disposition: A | Discharge status: Home / Self Care | Payer: BC Managed Care – PPO | Source: Ambulatory Visit | Attending: Physician Assistant | Admitting: Physician Assistant

## 2011-12-16 DIAGNOSIS — M546 Pain in thoracic spine: Secondary | ICD-10-CM | POA: Insufficient documentation

## 2011-12-16 DIAGNOSIS — M549 Dorsalgia, unspecified: Secondary | ICD-10-CM

## 2011-12-16 DIAGNOSIS — R1011 Right upper quadrant pain: Secondary | ICD-10-CM | POA: Insufficient documentation

## 2011-12-16 DIAGNOSIS — R110 Nausea: Secondary | ICD-10-CM | POA: Insufficient documentation

## 2011-12-16 DIAGNOSIS — R071 Chest pain on breathing: Secondary | ICD-10-CM | POA: Insufficient documentation

## 2011-12-16 DIAGNOSIS — R0789 Other chest pain: Secondary | ICD-10-CM

## 2011-12-16 NOTE — Progress Notes (Signed)
Subjective:    Patient ID: Thomas Dixon, male    DOB: [birth year], 36 y.o.   MRN: [id number]  HPI Thomas Dixon is a 36 year old male known to Dr. Jarold Motto with history of chronic GERD and Barrett's esophagus. Patient has history of ureterolithiasis and had undergone cardiac catheter in January of 2013 per Dr. Burnard Leigh. 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 At that time he was started on Plavix and aspirin and had not had any further chest pain. He was also started on Lipitor at that time. He was seen by GI April 17 of 2013 with complaints of 2 month history of epigastric and right upper quadrant pain which had been occurring at night. Had been having some sour brash type symptoms as well. He had not been on any regular PPI therapy and at that time was started on Nexium 40 mg daily in the a.m. Recheck labs which were unremarkable including a hepatic panel and then scheduled for upper endoscopy with Dr. Jarold Motto. The upper endoscopy showed evidence of chronic GERD and biopsies were done and were negative for Barrett's. We also scheduled him for CT scan of the abdomen and pelvis which was unremarkable. He had an ER visit on 12/14/2011 again with complaints of chest pain which he says is right-sided and associated with a sensation of a heart beat or throbbing type of discomfort in the right chest. Patient had an EKG which did not show any acute changes an initial CK was negative. He was referred to Dr. Burnard Leigh. and was seen earlier on the day of this visit Dr. Roe Coombs D. Lipitor had been stopped due to myalgias. Patient has extremely high lipids and was started on Crestor. He prescribed alprazolam 3 times daily as needed and fell to the patient's chest pain was secondary to anxiety. Note his symptoms were very atypical for cardiac etiology. He comes to see today for further evaluation.  Patient says he had 3 attacks  of this pain on May 12 ,he describes again as a throbbing heartbeat type of feeling in his right chest radiating into his back he says most frequently this happens at night but is now occurring during the daytime as well he's also been having a lot of nausea When he has episodes of this pain it may last several hours. This has not been associated by diaphoresis shortness of breath or syncope there. His stools have altered color, but he is not having any diarrhea. He denies any significant heartburn symptoms on Nexium and no dysphagia. Patient is very adamant that his symptoms are not secondary to anxiety.    Review of Systems  Constitutional: Positive for appetite change.  HENT: Negative.   Eyes: Negative.   Respiratory: Positive for chest tightness.   Cardiovascular: Positive for chest pain.  Gastrointestinal: Positive for nausea and abdominal pain.  Genitourinary: Negative.   Musculoskeletal: Negative.   Neurological: Negative.   Hematological: Negative.   Psychiatric/Behavioral: Negative.    Outpatient Prescriptions Prior to Visit  Medication Sig Dispense Refill  . aspirin EC 81 MG tablet Take 81 mg by mouth daily.      . cetirizine (ZYRTEC) 10 MG tablet Take 10 mg by mouth daily as needed. For seasonal allergies      . clopidogrel (PLAVIX) 75 MG tablet Take 1 tablet (75 mg total) by mouth daily.  30 tablet  1  .  esomeprazole (NEXIUM) 40 MG capsule Take 40 mg by mouth daily before breakfast.      . nitroGLYCERIN (NITROSTAT) 0.4 MG SL tablet Place 0.4 mg under the tongue every 5 (five) minutes as needed. For chest pain       Allergies  Allergen Reactions  . Esomeprazole Magnesium     REACTION: gas  . Omeprazole-Sodium Bicarbonate     REACTION: diarrhea  . Pantoprazole Sodium     PATIENT HAD UNCONTROLLABLE SHAKING AFTER TAKING  . Simvastatin     REACTION: muscle aches, eye symptoms, fatigue  . Rabeprazole Sodium Rash    "aciphex"   Patient Active Problem List  Diagnoses  .  HYPERLIPIDEMIA  . RHINITIS  . GERD  . BARRETTS ESOPHAGUS  . IBS  . POLYARTHRITIS  . MYALGIA  . DIZZINESS  . FATIGUE  . MUSCLE FASCICULATIONS  . JAW PAIN  . CHEST PAIN  . ATTENTION OR CONCENTRATION DEFICIT  . ARTHRALGIA  . Hyperlipidemia  . Aneurysm of coronary vessels       Objective:   Physical Exam well-developed young white male in no acute distress, blood pressure 112/60 pulse 84 height 5 foot 9 weight 216. HEEN;T nontraumatic normocephalic EOMI PERRLA sclera anicteric, Neck supple no JVD, Cardiovascular ;regular rate and rhythm with S1-S2 no murmur or gallop, Pulmonary; clear bilaterally there is no significant chest wall tenderness, Abdomen; he is mildly tender in the right upper quadrant and over the right anterior lower ribs no palpable mass or hepatosplenomegaly no guarding or rebound bowel sounds are active, Rectal; not done, Extremities; warm and dry no clubbing cyanosis or edema, Psych; mood and affect appropriate        Assessment & Plan:  #36 year old male with complaints of right-sided chest pain right back pain and right upper quadrant pain. He has documented aneurysmal dilation of his LAD which was felt possibly to explain complaints of substernal chest pain earlier in the year, patient is on Plavix and aspirin. He had cardiac evaluation earlier today and his pain at this point is felt noncardiac. I am not sure that his pain can be explained by any GI etiology. His symptoms are very atypical for acid reflux type symptoms or esophageal spasms. Plan; CT scan did not show any cholelithiasis however will rule out gallbladder disease with upper abdominal ultrasound and if this is negative CCK HIDA scan. Continue Nexium 40 mg by mouth every morning I suggested he give the alprazolam a try to see if that would help his symptoms. 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 Trial of Zofran 8 mg by mouth twice a day for  complaints of ongoing nausea

## 2011-12-16 NOTE — Progress Notes (Signed)
Reviewed and agree with management plan. Miliana Gangwer T. Marquette Blodgett MD FACG 

## 2011-12-23 ENCOUNTER — Ambulatory Visit: Payer: BC Managed Care – PPO | Admitting: Internal Medicine

## 2011-12-25 ENCOUNTER — Encounter (HOSPITAL_COMMUNITY)
Admission: RE | Admit: 2011-12-25 | Discharge: 2011-12-25 | Disposition: A | Discharge status: Home / Self Care | Payer: BC Managed Care – PPO | Source: Ambulatory Visit | Attending: Physician Assistant | Admitting: Physician Assistant

## 2011-12-25 ENCOUNTER — Telehealth: Payer: Self-pay | Admitting: *Deleted

## 2011-12-25 ENCOUNTER — Encounter (HOSPITAL_COMMUNITY): Payer: Self-pay

## 2011-12-25 DIAGNOSIS — R1011 Right upper quadrant pain: Secondary | ICD-10-CM | POA: Insufficient documentation

## 2011-12-25 DIAGNOSIS — R0789 Other chest pain: Secondary | ICD-10-CM

## 2011-12-25 DIAGNOSIS — M549 Dorsalgia, unspecified: Secondary | ICD-10-CM

## 2011-12-25 DIAGNOSIS — R110 Nausea: Secondary | ICD-10-CM | POA: Insufficient documentation

## 2011-12-25 MED ORDER — TECHNETIUM TC 99M MEBROFENIN IV KIT
5.5000 | PACK | Freq: Once | INTRAVENOUS | Status: AC | PRN
  Administered 2011-12-25: 5.5 via INTRAVENOUS

## 2011-12-25 MED ORDER — SINCALIDE 5 MCG IJ SOLR: 0.0200 ug/kg | Freq: Once | INTRAMUSCULAR | Status: DC

## 2011-12-25 NOTE — Telephone Encounter (Signed)
Called pt to schedule an appt to discuss CCK/HIDA results and his abdominal pain. Dr Jarold Motto had 2 openings tomorrow, but d/t his work schedule, he will keep the appt for 01/01/12.

## 2011-12-31 ENCOUNTER — Encounter: Payer: Self-pay | Admitting: *Deleted

## 2012-01-01 ENCOUNTER — Ambulatory Visit (INDEPENDENT_AMBULATORY_CARE_PROVIDER_SITE_OTHER): Payer: BC Managed Care – PPO | Admitting: Gastroenterology

## 2012-01-01 ENCOUNTER — Encounter: Payer: Self-pay | Admitting: Gastroenterology

## 2012-01-01 VITALS — BP 114/72 | HR 88 | Ht 69.0 in | Wt 216.6 lb

## 2012-01-01 DIAGNOSIS — M940 Chondrocostal junction syndrome [Tietze]: Secondary | ICD-10-CM

## 2012-01-01 DIAGNOSIS — Z8719 Personal history of other diseases of the digestive system: Secondary | ICD-10-CM

## 2012-01-01 DIAGNOSIS — K589 Irritable bowel syndrome without diarrhea: Secondary | ICD-10-CM

## 2012-01-01 MED ORDER — CELECOXIB 200 MG PO CAPS: 200.0000 mg | ORAL_CAPSULE | Freq: Two times a day (BID) | ORAL | Status: AC

## 2012-01-01 MED ORDER — ESOMEPRAZOLE MAGNESIUM 40 MG PO CPDR: 40.0000 mg | DELAYED_RELEASE_CAPSULE | Freq: Two times a day (BID) | ORAL | Status: DC

## 2012-01-01 NOTE — Patient Instructions (Signed)
Increase your Nexium to twice a day a new rx has been sent to your pharmacy.  We have referred you to a pain clinic, they will contact you with an appt.  Information given on Costochondritis.

## 2012-01-01 NOTE — Progress Notes (Signed)
This is a 36 year old Caucasian male who has had chronic acid reflux with recent negative endoscopy and upper abdominal ultrasound exam. He has IBS with abdominal gas and bloating, and also cares a diagnosis of polyarticular arthralgias, atypical chest pain, and is on Plavix 75 mg a day after he had a episode of tachycardia in January. I have been seeing him recently because of very atypical right-sided chest pain of unexplained etiology. Treatment with Nexium 40 mg every morning has helped slightly, but he continues to be awakened at night with rather sharp pain in his right chest area which she describes as severe, lasting 6-8 hours, and occurring 2-3 times a week. He cannot sleep on his right side because of the pain. There is no associated dysphagia or specific hepatobiliary complaints. He does work manually with heavy lifting and labor. He denies other gastrointestinal problems except for occasional hemorrhoidal irritation. His had no anorexia, weight loss, fever, chills, skin rashes, or mouth sores. Recent CCK-HIDA scan was entirely normal.  Current Medications, Allergies, Past Medical History, Past Surgical History, Family History and Social History were reviewed in [employer] record.  Pertinent Review of Systems Negative   Physical Exam: Blood pressure 114/72, pulse 88 and regular, weight 216 pounds with a BMI of 31.99. He is a healthy-appearing patient in no acute distress. I cannot appreciate stigmata of chronic liver disease, swollen joints, skin rashes, or phlebitis. His chest is entirely clear to percussion and auscultation, is in a regular rhythm without murmurs gallops or rubs. Abdominal exam shows no organomegaly, masses, or localized tenderness. Bowel sounds are normal without a succussion splash. There is direct tenderness over the right costochondral rib cage area very tender to touch. I cannot appreciate any increased warmth or definite mass. Palpation this area  directly reproduces his pain. Mental status is normal although he appears doubtful.    Assessment and Plan: I do not think this patient has gallbladder disease or any specific hepatic abnormality. His acid reflux may be contributing to his symptomatology, and I have increased his Nexium to twice a day. 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 Also I placed him on Celebrex 200 mg a day to be used twice a day if needed and tolerated. I do not think further GI evaluation is indicated at this time. Please send a copy of this note to Dr. Marga Melnick and to Dr. Jacinto Halim and cardiology. Encounter Diagnosis  Name Primary?  . Costochondritis Yes

## 2012-01-13 ENCOUNTER — Telehealth: Payer: Self-pay | Admitting: *Deleted

## 2012-01-13 NOTE — Telephone Encounter (Signed)
Notes already printed and in folder on desk.

## 2012-01-21 ENCOUNTER — Telehealth: Payer: Self-pay | Admitting: *Deleted

## 2012-01-21 NOTE — Telephone Encounter (Signed)
Dr Jarold Motto had asked for a referral to a pain clinic for pt d/t costochondritis. We received a reply back from Dr Thyra Breed ofc that he feels rheumatology would be better for the pt. Consulted with Dr Jarold Motto who instructed pt to remain on the Celebrex and if no better, we would refer him to ortho, probably Dr Darrelyn Hillock.  Spoke with pt who stated he is some better, but he didn't get the Celebrex. He reports his insurance would not pay for it and Mike Gip, PA ordered something else. He states he is some better and will continue taking the med and call later if he wants a referral. Checked with [facility] and pt was given Naproxen 500mg  BID with food. The pharmacist stated [employer] rejected Celebrex because pt needs to try 2 NSAIDS before Celebrex will be paid for. For Prior Auth, call [facility] [phone]

## 2012-03-08 ENCOUNTER — Other Ambulatory Visit: Payer: Self-pay | Admitting: Physician Assistant

## 2012-05-30 ENCOUNTER — Other Ambulatory Visit: Payer: Self-pay | Admitting: Gastroenterology

## 2012-12-02 ENCOUNTER — Encounter (INDEPENDENT_AMBULATORY_CARE_PROVIDER_SITE_OTHER): Payer: Self-pay | Admitting: General Surgery

## 2012-12-02 ENCOUNTER — Ambulatory Visit (INDEPENDENT_AMBULATORY_CARE_PROVIDER_SITE_OTHER): Payer: BC Managed Care – PPO | Admitting: General Surgery

## 2012-12-02 VITALS — BP 120/80 | HR 72 | Resp 18 | Ht 70.0 in | Wt 198.6 lb

## 2012-12-02 DIAGNOSIS — K8050 Calculus of bile duct without cholangitis or cholecystitis without obstruction: Secondary | ICD-10-CM

## 2012-12-02 DIAGNOSIS — K8020 Calculus of gallbladder without cholecystitis without obstruction: Secondary | ICD-10-CM

## 2012-12-02 NOTE — Progress Notes (Signed)
Patient ID: Thomas Dixon, male   DOB: 09-07-1975, 37 y.o.   MRN: [id number]  No chief complaint on file.   HPI Thomas Dixon is a 37 y.o. male.  The patient is a 37 year old male who referred by Dr. Jacinto Halim  Evaluation biliary colic. The patient's previous been worked up in the last year with a HIDA scan, ultrasound, and CT scan for right upper quadrant abdominal pain which has resulted in normal studies. The patient states that his right upper quadrant pain is now currently radiating to his right scapula as well as back area. He states that this pain starts after food, mainly fatty foods. The patient is tried to stay on a low-fat diet secondary to his cardiovascular and hyperlipidemia issues. 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 Jacinto Halim. HPI  Past Medical History  Diagnosis Date  . GERD (gastroesophageal reflux disease)   . IBS (irritable bowel syndrome)   . Gastritis   . Barrett's esophagus   . Hiatal hernia   . Hyperlipidemia   . Kidney stones     Past Surgical History  Procedure Laterality Date  . Cardiac catheterization      Family History  Problem Relation Age of Onset  . Colon polyps Father   . Heart disease Mother   . Heart disease Brother   . Heart disease Maternal Uncle     multiple  . Heart disease Maternal Aunt     multiple  . Heart disease Maternal Grandmother   . Pancreatic cancer Paternal Grandfather   . Colon cancer Neg Hx     Social History History  Substance Use Topics  . Smoking status: Former Smoker    Quit date: 11/19/2003  . Smokeless tobacco: Never Used     Comment: 7 years ago as of 2012  . Alcohol Use: No    Allergies  Allergen Reactions  . Omeprazole-Sodium Bicarbonate     REACTION: diarrhea  . Pantoprazole Sodium     PATIENT HAD UNCONTROLLABLE SHAKING AFTER TAKING  . Simvastatin     REACTION: muscle aches, eye symptoms, fatigue  . Rabeprazole Sodium Rash     "aciphex"    Current Outpatient Prescriptions  Medication Sig Dispense Refill  . aspirin EC 81 MG tablet Take 81 mg by mouth daily.      . cetirizine (ZYRTEC) 10 MG tablet Take 10 mg by mouth daily as needed. For seasonal allergies      . ezetimibe (ZETIA) 10 MG tablet Take 10 mg by mouth daily.      Marland Kitchen NEXIUM 40 MG capsule take 1 capsule by mouth twice a day  180 each  3  . nitroGLYCERIN (NITROSTAT) 0.4 MG SL tablet Place 0.4 mg under the tongue every 5 (five) minutes as needed. For chest pain      . rosuvastatin (CRESTOR) 10 MG tablet Take 10 mg by mouth daily.      . clopidogrel (PLAVIX) 75 MG tablet Take 1 tablet (75 mg total) by mouth daily.  30 tablet  1  . LIVALO 4 MG TABS       . WELCHOL 625 MG tablet        No current facility-administered medications for this visit.    Review of Systems Review of Systems  Constitutional: Negative.   HENT: Negative.   Eyes: Negative.   Respiratory: Negative.   Cardiovascular: Negative.   Gastrointestinal: Positive for nausea and abdominal pain (ruq).  Negative for vomiting.  Endocrine: Negative.   Musculoskeletal: Negative.   Neurological: Negative.     Blood pressure 120/80, pulse 72, resp. rate 18, height 5\' 10"  (1.778 m), weight 198 lb 9.6 oz (90.084 kg).  Physical Exam Physical Exam  Constitutional: He is oriented to person, place, and time. He appears well-developed and well-nourished.  HENT:  Head: Normocephalic and atraumatic.  Eyes: Conjunctivae and EOM are normal. Pupils are equal, round, and reactive to light.  Neck: Neck supple.  Cardiovascular: Normal rate, regular rhythm and normal heart sounds.   Pulmonary/Chest: Effort normal and breath sounds normal.  Abdominal: Soft. He exhibits no mass. There is no tenderness. There is no rebound and no guarding.  Musculoskeletal: Normal range of motion.  Neurological: He is alert and oriented to person, place, and time.    Data Reviewed all Radiological studies  reviewed  Assessment    The patient is a 37 year old male with likely biliary colic. Even though  the patient's studies at this point are negative, the patient's history sounds like biliary colic. I discussed with the patient that since his studies are normal this may not relieve the pain is having, but once we had the gallbladder out other organs can be specifically tested to see if they are causing pain.    Plan    1. We'll proceed to the operating room for laparoscopic cholecystectomy with no cholangiogram 2. We will have the patient cleared by Dr. Jacinto Halim to be able to be off his aspirin and Plavix for 5-7 days. We'll then schedule the surgery 2.All risks and benefits were discussed with the patient to generally include: infection, bleeding, possible need for post op ERCP, damage to the bile ducts, and bile leak. Alternatives were offered and described.  All questions were answered and the patient voiced understanding of the procedure and wishes to proceed at this point with a laparoscopic cholecystectomy         Marigene Ehlers., Gavynn Duvall 12/02/2012, 9:10 AM

## 2012-12-08 ENCOUNTER — Telehealth (INDEPENDENT_AMBULATORY_CARE_PROVIDER_SITE_OTHER): Payer: Self-pay | Admitting: General Surgery

## 2012-12-08 NOTE — Telephone Encounter (Signed)
Called patient at 5:02 to let him know that cardiac clearance was received today and that he would need to hold his ASA and Plavix 5 days prior to surgery.Marland KitchenMarland KitchenI told him that someone from our surgery schdn dept should be calling him and if he hadn't heard anything by Monday to call me back and that I would check the status .Marland KitchenPatient understood and was satisfied.

## 2012-12-09 ENCOUNTER — Encounter (INDEPENDENT_AMBULATORY_CARE_PROVIDER_SITE_OTHER): Payer: Self-pay

## 2012-12-17 ENCOUNTER — Emergency Department (HOSPITAL_COMMUNITY)
Admission: EM | Admit: 2012-12-17 | Discharge: 2012-12-18 | Disposition: A | Discharge status: Home / Self Care | Payer: BC Managed Care – PPO | Attending: Emergency Medicine | Admitting: Emergency Medicine

## 2012-12-17 DIAGNOSIS — G8929 Other chronic pain: Secondary | ICD-10-CM | POA: Insufficient documentation

## 2012-12-17 DIAGNOSIS — E860 Dehydration: Secondary | ICD-10-CM | POA: Insufficient documentation

## 2012-12-17 DIAGNOSIS — Z7982 Long term (current) use of aspirin: Secondary | ICD-10-CM | POA: Insufficient documentation

## 2012-12-17 DIAGNOSIS — R52 Pain, unspecified: Secondary | ICD-10-CM | POA: Insufficient documentation

## 2012-12-17 DIAGNOSIS — Z9889 Other specified postprocedural states: Secondary | ICD-10-CM | POA: Insufficient documentation

## 2012-12-17 DIAGNOSIS — Z87442 Personal history of urinary calculi: Secondary | ICD-10-CM | POA: Insufficient documentation

## 2012-12-17 DIAGNOSIS — L739 Follicular disorder, unspecified: Secondary | ICD-10-CM

## 2012-12-17 DIAGNOSIS — R21 Rash and other nonspecific skin eruption: Secondary | ICD-10-CM | POA: Insufficient documentation

## 2012-12-17 DIAGNOSIS — Y929 Unspecified place or not applicable: Secondary | ICD-10-CM | POA: Insufficient documentation

## 2012-12-17 DIAGNOSIS — L299 Pruritus, unspecified: Secondary | ICD-10-CM | POA: Insufficient documentation

## 2012-12-17 DIAGNOSIS — Y9389 Activity, other specified: Secondary | ICD-10-CM | POA: Insufficient documentation

## 2012-12-17 DIAGNOSIS — Z8639 Personal history of other endocrine, nutritional and metabolic disease: Secondary | ICD-10-CM | POA: Insufficient documentation

## 2012-12-17 DIAGNOSIS — Z7902 Long term (current) use of antithrombotics/antiplatelets: Secondary | ICD-10-CM | POA: Insufficient documentation

## 2012-12-17 DIAGNOSIS — Z79899 Other long term (current) drug therapy: Secondary | ICD-10-CM | POA: Insufficient documentation

## 2012-12-17 DIAGNOSIS — Z862 Personal history of diseases of the blood and blood-forming organs and certain disorders involving the immune mechanism: Secondary | ICD-10-CM | POA: Insufficient documentation

## 2012-12-17 DIAGNOSIS — R55 Syncope and collapse: Secondary | ICD-10-CM | POA: Insufficient documentation

## 2012-12-17 DIAGNOSIS — Z8719 Personal history of other diseases of the digestive system: Secondary | ICD-10-CM | POA: Insufficient documentation

## 2012-12-17 DIAGNOSIS — R1011 Right upper quadrant pain: Secondary | ICD-10-CM | POA: Insufficient documentation

## 2012-12-17 DIAGNOSIS — R110 Nausea: Secondary | ICD-10-CM | POA: Insufficient documentation

## 2012-12-17 DIAGNOSIS — Z87891 Personal history of nicotine dependence: Secondary | ICD-10-CM | POA: Insufficient documentation

## 2012-12-17 DIAGNOSIS — L738 Other specified follicular disorders: Secondary | ICD-10-CM | POA: Insufficient documentation

## 2012-12-17 DIAGNOSIS — K219 Gastro-esophageal reflux disease without esophagitis: Secondary | ICD-10-CM | POA: Insufficient documentation

## 2012-12-17 DIAGNOSIS — W57XXXA Bitten or stung by nonvenomous insect and other nonvenomous arthropods, initial encounter: Secondary | ICD-10-CM

## 2012-12-17 DIAGNOSIS — R197 Diarrhea, unspecified: Secondary | ICD-10-CM | POA: Insufficient documentation

## 2012-12-17 DIAGNOSIS — S90569A Insect bite (nonvenomous), unspecified ankle, initial encounter: Secondary | ICD-10-CM | POA: Insufficient documentation

## 2012-12-17 NOTE — ED Notes (Addendum)
Patient states that he has been having diarrhea for the past 3 days and tonight he began having non-stop diarrhea with Left upper quadrant abdominal pain (patient is scheduled to have his gall bladder out June 2nd) He pulled a tick off 1 wk ago that was on for about 5-6days on his left hip, he reports pain in his upper quadrant radiating in his chest and back.  There is a rash on his chest, abdomen and legs. Patient states he did wake up with a headache today.

## 2012-12-18 ENCOUNTER — Encounter (HOSPITAL_COMMUNITY): Payer: Self-pay | Admitting: Emergency Medicine

## 2012-12-18 LAB — CBC WITH DIFFERENTIAL/PLATELET
Basophils Absolute: 0 10*3/uL (ref 0.0–0.1)
Basophils Relative: 0 % (ref 0–1)
Hemoglobin: 15.5 g/dL (ref 13.0–17.0)
MCHC: 35.2 g/dL (ref 30.0–36.0)
Monocytes Relative: 3 % (ref 3–12)
Neutro Abs: 10.1 10*3/uL — ABNORMAL HIGH (ref 1.7–7.7)
Neutrophils Relative %: 87 % — ABNORMAL HIGH (ref 43–77)
RDW: 12.5 % (ref 11.5–15.5)

## 2012-12-18 LAB — COMPREHENSIVE METABOLIC PANEL
ALT: 14 U/L (ref 0–53)
AST: 18 U/L (ref 0–37)
Albumin: 4.4 g/dL (ref 3.5–5.2)
Alkaline Phosphatase: 67 U/L (ref 39–117)
Chloride: 101 mEq/L (ref 96–112)
GFR calc Af Amer: >90 mL/min (ref >90)
Potassium: 4 mEq/L (ref 3.5–5.1)
Total Bilirubin: 1.1 mg/dL (ref 0.3–1.2)

## 2012-12-18 LAB — URINALYSIS, ROUTINE W REFLEX MICROSCOPIC
Hgb urine dipstick: NEGATIVE
Ketones, ur: >80 mg/dL — AB
Nitrite: NEGATIVE
Specific Gravity, Urine: 1.023 (ref 1.005–1.030)
pH: 5 (ref 5.0–8.0)

## 2012-12-18 LAB — URINE MICROSCOPIC-ADD ON

## 2012-12-18 LAB — LIPASE, BLOOD: Lipase: 34 U/L (ref 11–59)

## 2012-12-18 LAB — AMYLASE: Amylase: 53 U/L (ref 0–105)

## 2012-12-18 MED ORDER — SODIUM CHLORIDE 0.9 % IV SOLN
1000.0000 mL | Freq: Once | INTRAVENOUS | Status: AC
  Administered 2012-12-18: 1000 mL via INTRAVENOUS

## 2012-12-18 MED ORDER — DICYCLOMINE HCL 20 MG PO TABS: 20.0000 mg | ORAL_TABLET | Freq: Four times a day (QID) | ORAL | Status: DC | PRN

## 2012-12-18 MED ORDER — ONDANSETRON 4 MG PO TBDP: 4.0000 mg | ORAL_TABLET | Freq: Three times a day (TID) | ORAL | Status: DC | PRN

## 2012-12-18 MED ORDER — LOPERAMIDE HCL 2 MG PO CAPS
4.0000 mg | ORAL_CAPSULE | Freq: Once | ORAL | Status: AC
  Administered 2012-12-18: 4 mg via ORAL
  Filled 2012-12-18: qty 2

## 2012-12-18 MED ORDER — DOXYCYCLINE HYCLATE 100 MG PO CAPS: 100.0000 mg | ORAL_CAPSULE | Freq: Two times a day (BID) | ORAL | Status: DC

## 2012-12-18 MED ORDER — ONDANSETRON HCL 4 MG/2ML IJ SOLN
4.0000 mg | Freq: Once | INTRAMUSCULAR | Status: AC
  Administered 2012-12-18: 4 mg via INTRAVENOUS
  Filled 2012-12-18: qty 2

## 2012-12-18 MED ORDER — SODIUM CHLORIDE 0.9 % IV SOLN
1000.0000 mL | INTRAVENOUS | Status: DC
  Administered 2012-12-18: 1000 mL via INTRAVENOUS

## 2012-12-18 MED ORDER — DICYCLOMINE HCL 10 MG PO CAPS
20.0000 mg | ORAL_CAPSULE | Freq: Once | ORAL | Status: AC
  Administered 2012-12-18: 20 mg via ORAL
  Filled 2012-12-18: qty 1

## 2012-12-18 NOTE — ED Provider Notes (Signed)
History     CSN: [id number]  Arrival date & time 12/17/12  2342   First MD Initiated Contact with Patient 12/18/12 0143      Chief Complaint  Patient presents with  . Abdominal Pain    (Consider location/radiation/quality/duration/timing/severity/associated sxs/prior treatment) HPI 37 yo male presents to the ER from home with complaint of diarrhea and crampy abd pain.  Pt also reports tick bite to left hip/buttock area one week ago.  Pt is unsure how long the tick was in place, but had not been in a outdoor area for 5 days prior.  Pt with chronic pain in RUQ with radiation into his chest, is scheduled for lap chole on 6/2.  Pt also c/o headache this morning, now resolved, and rash to chest, abd for several weeks.  Pt reports he has had some loose stools for the last few days, but today starting around 6 pm had multiple episodes back to back.  He reports episode of near syncope after multiple episodes of diarrhea.  This then "kicked up my gallbladder spasm" with onset of RUQ pain with nausea, no vomiting.  No fevers, no neck pain, no rash around the area of tick bite or other target lesions.  Rash to chest is small bumps around hair follicles.  He reports they grow big, rupture, then scab over.  Sometimes they itch.  Rash present prior to tick bite.  No myalgias or arthralgias.  Past Medical History  Diagnosis Date  . GERD (gastroesophageal reflux disease)   . IBS (irritable bowel syndrome)   . Gastritis   . Barrett's esophagus   . Hiatal hernia   . Hyperlipidemia   . Kidney stones     Past Surgical History  Procedure Laterality Date  . Cardiac catheterization      Family History  Problem Relation Age of Onset  . Colon polyps Father   . Heart disease Mother   . Heart disease Brother   . Heart disease Maternal Uncle     multiple  . Heart disease Maternal Aunt     multiple  . Heart disease Maternal Grandmother   . Pancreatic cancer Paternal Grandfather   . Colon cancer Neg  Hx     History  Substance Use Topics  . Smoking status: Former Smoker    Quit date: 11/19/2003  . Smokeless tobacco: Never Used     Comment: 7 years ago as of 2012  . Alcohol Use: No      Review of Systems  All other systems reviewed and are negative.  other than listed  Allergies  Omeprazole-sodium bicarbonate; Simvastatin; and Rabeprazole sodium  Home Medications   Current Outpatient Rx  Name  Route  Sig  Dispense  Refill  . aspirin EC 81 MG tablet   Oral   Take 81 mg by mouth daily.         . clopidogrel (PLAVIX) 75 MG tablet   Oral   Take 75 mg by mouth daily.         Marland Kitchen ezetimibe (ZETIA) 10 MG tablet   Oral   Take 10 mg by mouth daily.         Marland Kitchen LIVALO 4 MG TABS   Oral   Take 1 tablet by mouth daily with supper.          . pantoprazole (PROTONIX) 40 MG tablet   Oral   Take 40 mg by mouth daily.         Marland Kitchen EXPIRED: clopidogrel (PLAVIX)  75 MG tablet   Oral   Take 1 tablet (75 mg total) by mouth daily.   30 tablet   1   . nitroGLYCERIN (NITROSTAT) 0.4 MG SL tablet   Sublingual   Place 0.4 mg under the tongue every 5 (five) minutes as needed. For chest pain         . WELCHOL 625 MG tablet   Oral   Take 3,750 mg by mouth daily with supper.            BP 114/70  Pulse 100  Temp(Src) 98.2 F (36.8 C) (Oral)  Resp 14  Ht 5\' 10"  (1.778 m)  Wt 189 lb (85.73 kg)  BMI 27.12 kg/m2  SpO2 99%  Physical Exam  Nursing note and vitals reviewed. Constitutional: He is oriented to person, place, and time. He appears well-developed and well-nourished. He appears distressed (uncomfortable appearing).  HENT:  Head: Normocephalic and atraumatic.  Nose: Nose normal.  Mouth/Throat: Oropharynx is clear and moist.  Eyes: Conjunctivae and EOM are normal. Pupils are equal, round, and reactive to light.  Neck: Normal range of motion. Neck supple. No JVD present. No tracheal deviation present. No thyromegaly present.  Cardiovascular: Normal rate,  regular rhythm, normal heart sounds and intact distal pulses.  Exam reveals no gallop and no friction rub.   No murmur heard. Pulmonary/Chest: Effort normal and breath sounds normal. No stridor. No respiratory distress. He has no wheezes. He has no rales. He exhibits no tenderness.  Abdominal: Soft. He exhibits no distension and no mass. There is tenderness (mild diffuse tenderness, worse in RUQ, epigastrium). There is no rebound and no guarding.  Hyperactive bowel sounds  Musculoskeletal: Normal range of motion. He exhibits no edema and no tenderness.  Lymphadenopathy:    He has no cervical adenopathy.  Neurological: He is alert and oriented to person, place, and time. He exhibits normal muscle tone. Coordination normal.  Skin: Skin is warm and dry. Rash noted. No erythema. No pallor.  Healing insect bite noted to left posterior hip  Scattered pustules noted to chest, abdomen without cellulitis, petechia, papules  Psychiatric: He has a normal mood and affect. His behavior is normal. Judgment and thought content normal.    ED Course  Procedures (including critical care time)  Labs Reviewed  CBC WITH DIFFERENTIAL - Abnormal; Notable for the following:    WBC 11.6 (*)    Neutrophils Relative % 87 (*)    Neutro Abs 10.1 (*)    Lymphocytes Relative 10 (*)    All other components within normal limits  URINALYSIS, ROUTINE W REFLEX MICROSCOPIC - Abnormal; Notable for the following:    Bilirubin Urine SMALL (*)    Ketones, ur >80 (*)    Protein, ur 30 (*)    All other components within normal limits  STOOL CULTURE  COMPREHENSIVE METABOLIC PANEL  AMYLASE  LIPASE, BLOOD  URINE MICROSCOPIC-ADD ON  LYME DISEASE DNA BY PCR(BORRELIA BURG)  B. BURGDORFI ANTIBODIES   No results found.   1. Diarrhea   2. Tick bite   3. Folliculitis   4. Chronic RUQ pain   5. Dehydration       MDM  37 yo male with diarrhea, dizziness, dehydration.  Also with h/o recent tick bite, acute on chronic  RUQ pain, and folliculitis.  Will give IVF for dehydration and imodium.  Will plan to send for lyme, but will treat with doxycycline to cover both folliculitis and possible tick borne illness  Olivia Mackie, MD 12/18/12 [phone]

## 2012-12-21 LAB — B. BURGDORFI ANTIBODIES: B burgdorferi Ab IgG+IgM: 0.29 {ISR}

## 2012-12-22 ENCOUNTER — Encounter (HOSPITAL_COMMUNITY): Payer: Self-pay | Admitting: Pharmacist

## 2012-12-22 LAB — STOOL CULTURE: Special Requests: NORMAL

## 2012-12-28 NOTE — Pre-Procedure Instructions (Signed)
Thomas Dixon  12/28/2012   Your procedure is scheduled on: 01-03-2013    Monday   Report to [facility] Stay [facility] at 7:30 AM.  Call this number if you have problems the morning of surgery: [phone]   Remember:   Do not eat food or drink liquids after midnight.    Take these medicines the morning of surgery with A SIP OF WATER: doxycyline(Vibramycin),protonix,              STOP PLAVIX 5 DAYS PRIOR TO SURGERY   Do not wear jewelry  Do not wear lotions, powders, or perfumes.   Do not shave 48 hours prior to surgery. Men may shave face and neck.  Do not bring valuables to the hospital.  Contacts, dentures or bridgework may not be worn into surgery.  Leave suitcase in the car. After surgery it may be brought to your room.   For patients admitted to the hospital, checkout time is 11:00 AM the day of discharge.   Patients discharged the day of surgery will not be allowed to drive home.   Name and phone number of your driver:    Special Instructions: Shower using CHG 2 nights before surgery and the night before surgery.  If you shower the day of surgery use CHG.  Use special wash - you have one bottle of CHG for all showers.  You should use approximately 1/3 of the bottle for each shower.   Please read over the following fact sheets that you were given: Pain Booklet, Coughing and Deep Breathing and Surgical Site Infection Prevention

## 2012-12-29 ENCOUNTER — Ambulatory Visit (HOSPITAL_COMMUNITY)
Admission: RE | Admit: 2012-12-29 | Discharge: 2012-12-29 | Disposition: A | Discharge status: Home / Self Care | Payer: BC Managed Care – PPO | Source: Ambulatory Visit | Attending: Anesthesiology | Admitting: Anesthesiology

## 2012-12-29 ENCOUNTER — Encounter (HOSPITAL_COMMUNITY): Payer: Self-pay

## 2012-12-29 ENCOUNTER — Encounter (HOSPITAL_COMMUNITY)
Admission: RE | Admit: 2012-12-29 | Discharge: 2012-12-29 | Disposition: A | Discharge status: Home / Self Care | Payer: BC Managed Care – PPO | Source: Ambulatory Visit | Attending: General Surgery | Admitting: General Surgery

## 2012-12-29 HISTORY — DX: Atherosclerotic heart disease of native coronary artery without angina pectoris: I25.10

## 2012-12-29 LAB — CBC
HCT: 45 % (ref 39.0–52.0)
Hemoglobin: 15 g/dL (ref 13.0–17.0)
MCH: 29.9 pg (ref 26.0–34.0)
MCHC: 33.3 g/dL (ref 30.0–36.0)
MCV: 89.6 fL (ref 78.0–100.0)
RBC: 5.02 MIL/uL (ref 4.22–5.81)

## 2012-12-29 LAB — BASIC METABOLIC PANEL
BUN: 14 mg/dL (ref 6–23)
CO2: 23 mEq/L (ref 19–32)
Chloride: 104 mEq/L (ref 96–112)
Glucose, Bld: 93 mg/dL (ref 70–99)
Potassium: 4.9 mEq/L (ref 3.5–5.1)
Sodium: 139 mEq/L (ref 135–145)

## 2012-12-29 LAB — PROTIME-INR: Prothrombin Time: 13.4 seconds (ref 11.6–15.2)

## 2012-12-29 NOTE — Progress Notes (Signed)
EKG requested from Dr. Verl Dicker office

## 2013-01-02 MED ORDER — CEFAZOLIN SODIUM-DEXTROSE 2-3 GM-% IV SOLR
2.0000 g | INTRAVENOUS | Status: AC
  Administered 2013-01-03: 2 g via INTRAVENOUS
  Filled 2013-01-02: qty 50

## 2013-01-03 ENCOUNTER — Ambulatory Visit (HOSPITAL_COMMUNITY)
Admission: RE | Admit: 2013-01-03 | Discharge: 2013-01-03 | Disposition: A | Discharge status: Home / Self Care | Payer: BC Managed Care – PPO | Source: Ambulatory Visit | Attending: General Surgery | Admitting: General Surgery

## 2013-01-03 ENCOUNTER — Encounter (HOSPITAL_COMMUNITY): Payer: Self-pay | Admitting: *Deleted

## 2013-01-03 ENCOUNTER — Encounter (HOSPITAL_COMMUNITY): Payer: Self-pay | Admitting: Anesthesiology

## 2013-01-03 ENCOUNTER — Ambulatory Visit (HOSPITAL_COMMUNITY): Payer: BC Managed Care – PPO | Admitting: Anesthesiology

## 2013-01-03 ENCOUNTER — Encounter (HOSPITAL_COMMUNITY)
Admission: RE | Disposition: A | Discharge status: Home / Self Care | Payer: Self-pay | Source: Ambulatory Visit | Attending: General Surgery

## 2013-01-03 DIAGNOSIS — K811 Chronic cholecystitis: Secondary | ICD-10-CM

## 2013-01-03 DIAGNOSIS — Z79899 Other long term (current) drug therapy: Secondary | ICD-10-CM | POA: Insufficient documentation

## 2013-01-03 DIAGNOSIS — K2960 Other gastritis without bleeding: Secondary | ICD-10-CM | POA: Insufficient documentation

## 2013-01-03 DIAGNOSIS — Z87891 Personal history of nicotine dependence: Secondary | ICD-10-CM | POA: Insufficient documentation

## 2013-01-03 DIAGNOSIS — Z87442 Personal history of urinary calculi: Secondary | ICD-10-CM | POA: Insufficient documentation

## 2013-01-03 DIAGNOSIS — K449 Diaphragmatic hernia without obstruction or gangrene: Secondary | ICD-10-CM | POA: Insufficient documentation

## 2013-01-03 DIAGNOSIS — K2270 Barrett's esophagus without dysplasia: Secondary | ICD-10-CM | POA: Insufficient documentation

## 2013-01-03 DIAGNOSIS — K219 Gastro-esophageal reflux disease without esophagitis: Secondary | ICD-10-CM | POA: Insufficient documentation

## 2013-01-03 DIAGNOSIS — E785 Hyperlipidemia, unspecified: Secondary | ICD-10-CM | POA: Insufficient documentation

## 2013-01-03 DIAGNOSIS — Z7982 Long term (current) use of aspirin: Secondary | ICD-10-CM | POA: Insufficient documentation

## 2013-01-03 DIAGNOSIS — Z888 Allergy status to other drugs, medicaments and biological substances status: Secondary | ICD-10-CM | POA: Insufficient documentation

## 2013-01-03 DIAGNOSIS — K589 Irritable bowel syndrome without diarrhea: Secondary | ICD-10-CM | POA: Insufficient documentation

## 2013-01-03 HISTORY — PX: CHOLECYSTECTOMY: SHX55

## 2013-01-03 SURGERY — LAPAROSCOPIC CHOLECYSTECTOMY
Anesthesia: General | Site: Abdomen | Wound class: Clean Contaminated

## 2013-01-03 MED ORDER — BUPIVACAINE HCL 0.25 % IJ SOLN
INTRAMUSCULAR | Status: DC | PRN
  Administered 2013-01-03: 8 mL

## 2013-01-03 MED ORDER — NEOSTIGMINE METHYLSULFATE 1 MG/ML IJ SOLN
INTRAMUSCULAR | Status: DC | PRN
  Administered 2013-01-03: 4 mg via INTRAVENOUS

## 2013-01-03 MED ORDER — ACETAMINOPHEN 650 MG RE SUPP: 650.0000 mg | RECTAL | Status: DC | PRN

## 2013-01-03 MED ORDER — PROPOFOL 10 MG/ML IV BOLUS
INTRAVENOUS | Status: DC | PRN
  Administered 2013-01-03: 200 mg via INTRAVENOUS

## 2013-01-03 MED ORDER — SODIUM CHLORIDE 0.9 % IJ SOLN: 3.0000 mL | INTRAMUSCULAR | Status: DC | PRN

## 2013-01-03 MED ORDER — OXYCODONE HCL 5 MG PO TABS
5.0000 mg | ORAL_TABLET | Freq: Once | ORAL | Status: AC | PRN
  Administered 2013-01-03: 5 mg via ORAL
  Filled 2013-01-03: qty 1

## 2013-01-03 MED ORDER — DEXAMETHASONE SODIUM PHOSPHATE 10 MG/ML IJ SOLN
INTRAMUSCULAR | Status: DC | PRN
  Administered 2013-01-03: 8 mg via INTRAVENOUS

## 2013-01-03 MED ORDER — SODIUM CHLORIDE 0.9 % IV SOLN: 250.0000 mL | INTRAVENOUS | Status: DC | PRN

## 2013-01-03 MED ORDER — ONDANSETRON HCL 4 MG/2ML IJ SOLN
4.0000 mg | Freq: Four times a day (QID) | INTRAMUSCULAR | Status: DC | PRN
  Administered 2013-01-03: 4 mg via INTRAVENOUS
  Filled 2013-01-03: qty 2

## 2013-01-03 MED ORDER — 0.9 % SODIUM CHLORIDE (POUR BTL) OPTIME
TOPICAL | Status: DC | PRN
  Administered 2013-01-03: 1000 mL

## 2013-01-03 MED ORDER — LACTATED RINGERS IV SOLN
INTRAVENOUS | Status: DC | PRN
  Administered 2013-01-03: 09:00:00 via INTRAVENOUS

## 2013-01-03 MED ORDER — ONDANSETRON HCL 4 MG/2ML IJ SOLN
INTRAMUSCULAR | Status: AC
  Filled 2013-01-03: qty 2

## 2013-01-03 MED ORDER — LACTATED RINGERS IV SOLN
INTRAVENOUS | Status: DC
  Administered 2013-01-03: 09:00:00 via INTRAVENOUS

## 2013-01-03 MED ORDER — HYDROMORPHONE HCL PF 1 MG/ML IJ SOLN
INTRAMUSCULAR | Status: AC
  Filled 2013-01-03: qty 1

## 2013-01-03 MED ORDER — CHLORHEXIDINE GLUCONATE 4 % EX LIQD: 1.0000 "application " | Freq: Once | CUTANEOUS | Status: DC

## 2013-01-03 MED ORDER — SODIUM CHLORIDE 0.9 % IR SOLN
Status: DC | PRN
  Administered 2013-01-03: 1000 mL

## 2013-01-03 MED ORDER — LIDOCAINE HCL (CARDIAC) 20 MG/ML IV SOLN
INTRAVENOUS | Status: DC | PRN
  Administered 2013-01-03: 80 mg via INTRAVENOUS

## 2013-01-03 MED ORDER — OXYCODONE HCL 5 MG/5ML PO SOLN: 5.0000 mg | Freq: Once | ORAL | Status: AC | PRN

## 2013-01-03 MED ORDER — ONDANSETRON HCL 4 MG/2ML IJ SOLN
INTRAMUSCULAR | Status: DC | PRN
  Administered 2013-01-03: 4 mg via INTRAVENOUS

## 2013-01-03 MED ORDER — MIDAZOLAM HCL 5 MG/5ML IJ SOLN
INTRAMUSCULAR | Status: DC | PRN
  Administered 2013-01-03: 2 mg via INTRAVENOUS

## 2013-01-03 MED ORDER — ACETAMINOPHEN 325 MG PO TABS: 650.0000 mg | ORAL_TABLET | ORAL | Status: DC | PRN

## 2013-01-03 MED ORDER — HYDROMORPHONE HCL PF 1 MG/ML IJ SOLN
0.2500 mg | INTRAMUSCULAR | Status: DC | PRN
  Administered 2013-01-03 (×2): 0.5 mg via INTRAVENOUS

## 2013-01-03 MED ORDER — OXYCODONE HCL 5 MG PO TABS: 5.0000 mg | ORAL_TABLET | ORAL | Status: DC | PRN

## 2013-01-03 MED ORDER — PROMETHAZINE HCL 25 MG/ML IJ SOLN: 6.2500 mg | INTRAMUSCULAR | Status: DC | PRN

## 2013-01-03 MED ORDER — SODIUM CHLORIDE 0.9 % IJ SOLN: 3.0000 mL | Freq: Two times a day (BID) | INTRAMUSCULAR | Status: DC

## 2013-01-03 MED ORDER — MEPERIDINE HCL 25 MG/ML IJ SOLN: 6.2500 mg | INTRAMUSCULAR | Status: DC | PRN

## 2013-01-03 MED ORDER — FENTANYL CITRATE 0.05 MG/ML IJ SOLN
INTRAMUSCULAR | Status: DC | PRN
  Administered 2013-01-03 (×4): 50 ug via INTRAVENOUS

## 2013-01-03 MED ORDER — GLYCOPYRROLATE 0.2 MG/ML IJ SOLN
INTRAMUSCULAR | Status: DC | PRN
  Administered 2013-01-03: .8 mg via INTRAVENOUS

## 2013-01-03 MED ORDER — OXYCODONE-ACETAMINOPHEN 10-325 MG PO TABS: 1.0000 | ORAL_TABLET | ORAL | Status: DC | PRN

## 2013-01-03 MED ORDER — BUPIVACAINE HCL (PF) 0.25 % IJ SOLN
INTRAMUSCULAR | Status: AC
  Filled 2013-01-03: qty 30

## 2013-01-03 MED ORDER — ROCURONIUM BROMIDE 100 MG/10ML IV SOLN
INTRAVENOUS | Status: DC | PRN
  Administered 2013-01-03: 50 mg via INTRAVENOUS

## 2013-01-03 SURGICAL SUPPLY — 49 items
APL SKNCLS STERI-STRIP NONHPOA (GAUZE/BANDAGES/DRESSINGS) ×1
APPLIER CLIP 5 13 M/L LIGAMAX5 (MISCELLANEOUS) ×2
APR CLP MED LRG 5 ANG JAW (MISCELLANEOUS) ×1
BAG SPEC RTRVL LRG 6X4 10 (ENDOMECHANICALS)
BENZOIN TINCTURE PRP APPL 2/3 (GAUZE/BANDAGES/DRESSINGS) ×2 IMPLANT
CANISTER SUCTION 2500CC (MISCELLANEOUS) ×2 IMPLANT
CHLORAPREP W/TINT 26ML (MISCELLANEOUS) ×2 IMPLANT
CLIP APPLIE 5 13 M/L LIGAMAX5 (MISCELLANEOUS) ×1 IMPLANT
CLOTH BEACON ORANGE TIMEOUT ST (SAFETY) ×2 IMPLANT
COVER MAYO STAND STRL (DRAPES) IMPLANT
COVER SURGICAL LIGHT HANDLE (MISCELLANEOUS) ×2 IMPLANT
COVER TRANSDUCER ULTRASND (DRAPES) ×2 IMPLANT
DEVICE TROCAR PUNCTURE CLOSURE (ENDOMECHANICALS) ×2 IMPLANT
DRAPE C-ARM 42X72 X-RAY (DRAPES) IMPLANT
DRAPE UTILITY 15X26 W/TAPE STR (DRAPE) ×4 IMPLANT
ELECT REM PT RETURN 9FT ADLT (ELECTROSURGICAL) ×2
ELECTRODE REM PT RTRN 9FT ADLT (ELECTROSURGICAL) ×1 IMPLANT
GAUZE SPONGE 2X2 8PLY STRL LF (GAUZE/BANDAGES/DRESSINGS) ×1 IMPLANT
GLOVE BIO SURGEON STRL SZ7.5 (GLOVE) ×2 IMPLANT
GLOVE BIOGEL PI IND STRL 6.5 (GLOVE) IMPLANT
GLOVE BIOGEL PI IND STRL 7.0 (GLOVE) IMPLANT
GLOVE BIOGEL PI INDICATOR 6.5 (GLOVE) ×1
GLOVE BIOGEL PI INDICATOR 7.0 (GLOVE) ×2
GLOVE ECLIPSE 7.0 STRL STRAW (GLOVE) ×1 IMPLANT
GLOVE SURG SS PI 7.0 STRL IVOR (GLOVE) ×1 IMPLANT
GOWN STRL NON-REIN LRG LVL3 (GOWN DISPOSABLE) ×5 IMPLANT
GOWN STRL REIN XL XLG (GOWN DISPOSABLE) ×2 IMPLANT
IV CATH 14GX2 1/4 (CATHETERS) IMPLANT
KIT BASIN OR (CUSTOM PROCEDURE TRAY) ×2 IMPLANT
KIT ROOM TURNOVER OR (KITS) ×2 IMPLANT
NDL INSUFFLATION 14GA 120MM (NEEDLE) ×1 IMPLANT
NEEDLE INSUFFLATION 14GA 120MM (NEEDLE) ×2 IMPLANT
NS IRRIG 1000ML POUR BTL (IV SOLUTION) ×2 IMPLANT
PAD ARMBOARD 7.5X6 YLW CONV (MISCELLANEOUS) ×4 IMPLANT
POUCH SPECIMEN RETRIEVAL 10MM (ENDOMECHANICALS) IMPLANT
SCISSORS LAP 5X35 DISP (ENDOMECHANICALS) ×2 IMPLANT
SET CHOLANGIOGRAPHY FRANKLIN (SET/KITS/TRAYS/PACK) IMPLANT
SET IRRIG TUBING LAPAROSCOPIC (IRRIGATION / IRRIGATOR) ×2 IMPLANT
SLEEVE ENDOPATH XCEL 5M (ENDOMECHANICALS) ×2 IMPLANT
SPECIMEN JAR MEDIUM (MISCELLANEOUS) ×1 IMPLANT
SPECIMEN JAR SMALL (MISCELLANEOUS) ×1 IMPLANT
SPONGE GAUZE 2X2 STER 10/PKG (GAUZE/BANDAGES/DRESSINGS) ×1
SUT MNCRL AB 3-0 PS2 18 (SUTURE) ×2 IMPLANT
TAPE CLOTH SURG 4X10 WHT LF (GAUZE/BANDAGES/DRESSINGS) ×1 IMPLANT
TOWEL OR 17X24 6PK STRL BLUE (TOWEL DISPOSABLE) ×2 IMPLANT
TOWEL OR 17X26 10 PK STRL BLUE (TOWEL DISPOSABLE) ×2 IMPLANT
TRAY LAPAROSCOPIC (CUSTOM PROCEDURE TRAY) ×2 IMPLANT
TROCAR XCEL NON-BLD 11X100MML (ENDOMECHANICALS) ×2 IMPLANT
TROCAR XCEL NON-BLD 5MMX100MML (ENDOMECHANICALS) ×2 IMPLANT

## 2013-01-03 NOTE — Transfer of Care (Addendum)
Immediate Anesthesia Transfer of Care Note  Patient: Thomas Dixon  Procedure(s) Performed: Procedure(s): LAPAROSCOPIC CHOLECYSTECTOMY (N/A)  Patient Location: PACU  Anesthesia Type:General  Level of Consciousness: awake, alert  and oriented  Airway & Oxygen Therapy: Patient Spontanous Breathing and Patient connected to nasal cannula oxygen  Post-op Assessment: Report given to PACU RN and Post -op Vital signs reviewed and stable  Post vital signs: Reviewed and stable  Complications: No apparent anesthesia complications

## 2013-01-03 NOTE — Op Note (Signed)
Pre Operative Diagnosis:  Biliary colic  Post Operative Diagnosis: same  Procedure: Lap chole  Surgeon: Dr. Axel Filler  Assistant: none  Anesthesia: GETA  EBL: <5cc  Complications: none  Counts: reported as correct x 2  Findings:  none  Indications for procedure:  Pt is a 37 y/o M with RUQ pain with fatty meals.  Pt was counseled in clinic and decided to have his gallbladder electively.  Details of the procedure:The patient was taken to the operating and placed in the supine position with bilateral SCDs in place. A time out was called and all facts were verified. A pneumoperitoneum was obtained via A Veress needle technique to a pressure of 14mm of mercury. A 5mm trochar was then placed in the right upper quadrant under visualization, and there were no injuries to any abdominal organs. A 11 mm port was then placed in the umbilical region after infiltrating with local anesthesia under direct visualization. A second epigastric port was placed under direct visualization. The gallbladder was identified and retracted, the peritoneum was then sharply dissected from the gallbladder and this dissection was carried down to Calot's triangle. The cystic duct was identified and stripped away circumferentially and seen going into the gallbladder 360. A Cook catheter was used to perform an intraoperative cholangiogram. The biliary radicals as well as the cystic duct and common bile duct were seen free of filling defects.  2 clips were placed proximally one distally and the cystic duct transected. The cystic artery was identified and 2 clips placed proximally and one distally and transected.  We then proceeded to remove the gallbladder off the hepatic fossa with Bovie cautery. A latex retrieval bag was then placed in the abdomen and gallbladder placed in the bag. The hepatic fossa was then reexamined and hemostasis was achieved with laparoscopic specula and Bovie cautery and was excellent at this  portion of the case. The subhepatic fossa and perihepatic fossa was then irrigated until the effluent was clear. The 11 mm trocar fascia was reapproximated with the Endo Close #1 Vicryl x2.  The pneumoperitoneum was evacuated and all trochars removed under direct visulalization.  The skin was then closed with 4-0 Monocryl and the skin dressed with Steri-Strips, gauze, and tape.  The patient was awaken from general anesthesia and taken to the recovery room in stable condition.

## 2013-01-03 NOTE — Preoperative (Signed)
Beta Blockers   Reason not to administer Beta Blockers:Not Applicable 

## 2013-01-03 NOTE — Anesthesia Postprocedure Evaluation (Signed)
  Anesthesia Post-op Note  Patient: Thomas Dixon  Procedure(s) Performed: Procedure(s): LAPAROSCOPIC CHOLECYSTECTOMY (N/A)  Patient Location: PACU  Anesthesia Type:General  Level of Consciousness: awake and sedated  Airway and Oxygen Therapy: Patient Spontanous Breathing  Post-op Pain:   Post-op Assessment: Post-op Vital signs reviewed  Post-op Vital Signs: stable  Complications: No apparent anesthesia complications

## 2013-01-03 NOTE — H&P (Signed)
HPI  Thomas Dixon is a 37 y.o. male. The patient is a 37 year old male who referred by Dr. Jacinto Halim Evaluation biliary colic. The patient's previous been worked up in the last year with a HIDA scan, ultrasound, and CT scan for right upper quadrant abdominal pain which has resulted in normal studies. The patient states that his right upper quadrant pain is now currently radiating to his right scapula as well as back area. He states that this pain starts after food, mainly fatty foods. The patient is tried to stay on a low-fat diet secondary to his cardiovascular and hyperlipidemia issues. 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 Jacinto Halim.  HPI  Past Medical History   Diagnosis  Date   .  GERD (gastroesophageal reflux disease)    .  IBS (irritable bowel syndrome)    .  Gastritis    .  Barrett's esophagus    .  Hiatal hernia    .  Hyperlipidemia    .  Kidney stones     Past Surgical History   Procedure  Laterality  Date   .  Cardiac catheterization      Family History   Problem  Relation  Age of Onset   .  Colon polyps  Father    .  Heart disease  Mother    .  Heart disease  Brother    .  Heart disease  Maternal Uncle      multiple   .  Heart disease  Maternal Aunt      multiple   .  Heart disease  Maternal Grandmother    .  Pancreatic cancer  Paternal Grandfather    .  Colon cancer  Neg Hx    Social History  History   Substance Use Topics   .  Smoking status:  Former Smoker     Quit date:  11/19/2003   .  Smokeless tobacco:  Never Used      Comment: 7 years ago as of 2012   .  Alcohol Use:  No    Allergies   Allergen  Reactions   .  Omeprazole-Sodium Bicarbonate      REACTION: diarrhea   .  Pantoprazole Sodium      PATIENT HAD UNCONTROLLABLE SHAKING AFTER TAKING   .  Simvastatin      REACTION: muscle aches, eye symptoms, fatigue   .  Rabeprazole Sodium  Rash     "aciphex"    Current Outpatient  Prescriptions   Medication  Sig  Dispense  Refill   .  aspirin EC 81 MG tablet  Take 81 mg by mouth daily.     .  cetirizine (ZYRTEC) 10 MG tablet  Take 10 mg by mouth daily as needed. For seasonal allergies     .  ezetimibe (ZETIA) 10 MG tablet  Take 10 mg by mouth daily.     Marland Kitchen  NEXIUM 40 MG capsule  take 1 capsule by mouth twice a day  180 each  3   .  nitroGLYCERIN (NITROSTAT) 0.4 MG SL tablet  Place 0.4 mg under the tongue every 5 (five) minutes as needed. For chest pain     .  rosuvastatin (CRESTOR) 10 MG tablet  Take 10 mg by mouth daily.     .  clopidogrel (PLAVIX) 75 MG tablet  Take 1 tablet (75 mg total) by mouth daily.  30 tablet  1   .  LIVALO 4 MG TABS      .  WELCHOL 625 MG tablet       No current facility-administered medications for this visit.   Review of Systems  Review of Systems  Constitutional: Negative.  HENT: Negative.  Eyes: Negative.  Respiratory: Negative.  Cardiovascular: Negative.  Gastrointestinal: Positive for nausea and abdominal pain (ruq). Negative for vomiting.  Endocrine: Negative.  Musculoskeletal: Negative.  Neurological: Negative.  Blood pressure 120/80, pulse 72, resp. rate 18, height 5\' 10"  (1.778 m), weight 198 lb 9.6 oz (90.084 kg).  Physical Exam  Physical Exam  Constitutional: He is oriented to person, place, and time. He appears well-developed and well-nourished.  HENT:  Head: Normocephalic and atraumatic.  Eyes: Conjunctivae and EOM are normal. Pupils are equal, round, and reactive to light.  Neck: Neck supple.  Cardiovascular: Normal rate, regular rhythm and normal heart sounds.  Pulmonary/Chest: Effort normal and breath sounds normal.  Abdominal: Soft. He exhibits no mass. There is no tenderness. There is no rebound and no guarding.  Musculoskeletal: Normal range of motion.  Neurological: He is alert and oriented to person, place, and time.  Data Reviewed  all Radiological studies reviewed  Assessment  The patient is a 37 year old  male with likely biliary colic. Even though the patient's studies at this point are negative, the patient's history sounds like biliary colic. I discussed with the patient that since his studies are normal this may not relieve the pain is having, but once we had the gallbladder out other organs can be specifically tested to see if they are causing pain.  Plan  1. We'll proceed to the operating room for laparoscopic cholecystectomy with no cholangiogram  2.All risks and benefits were discussed with the patient to generally include: infection, bleeding, possible need for post op ERCP, damage to the bile ducts, and bile leak. Alternatives were offered and described. All questions were answered and the patient voiced understanding of the procedure and wishes to proceed at this point with a laparoscopic cholecystectomy

## 2013-01-03 NOTE — Anesthesia Preprocedure Evaluation (Addendum)
Anesthesia Evaluation  Patient identified by MRN, date of birth, ID band Patient awake    History of Anesthesia Complications (+) PONV  Airway Mallampati: I TM Distance: >3 FB Neck ROM: Full    Dental  (+) Teeth Intact and Dental Advisory Given   Pulmonary neg pulmonary ROS,  breath sounds clear to auscultation        Cardiovascular + CAD Rhythm:Regular Rate:Normal  Hx of RCA aneurysm   Neuro/Psych  Neuromuscular disease negative psych ROS   GI/Hepatic hiatal hernia, GERD-  Medicated and Controlled,  Endo/Other    Renal/GU      Musculoskeletal negative musculoskeletal ROS (+)   Abdominal   Peds  Hematology negative hematology ROS (+)   Anesthesia Other Findings   Reproductive/Obstetrics negative OB ROS                        Anesthesia Physical Anesthesia Plan  ASA: II  Anesthesia Plan: General   Post-op Pain Management:    Induction: Intravenous  Airway Management Planned: Oral ETT  Additional Equipment:   Intra-op Plan:   Post-operative Plan: Extubation in OR  Informed Consent: I have reviewed the patients History and Physical, chart, labs and discussed the procedure including the risks, benefits and alternatives for the proposed anesthesia with the patient or authorized representative who has indicated his/her understanding and acceptance.   Dental advisory given  Plan Discussed with: CRNA and Surgeon  Anesthesia Plan Comments:        Anesthesia Quick Evaluation

## 2013-01-04 ENCOUNTER — Encounter (HOSPITAL_COMMUNITY): Payer: Self-pay | Admitting: General Surgery

## 2013-01-04 ENCOUNTER — Telehealth (INDEPENDENT_AMBULATORY_CARE_PROVIDER_SITE_OTHER): Payer: Self-pay | Admitting: General Surgery

## 2013-01-04 NOTE — Telephone Encounter (Signed)
He had a lap chole yesterday and has been taking Oxycodone for pain.  He developed itching on his buttock area and trunk this evening with no visible rash.  I told him he was probably having a reaction to the Oxycodone and recommended he take Benadryl and stop the Oxycodone.  I told him to use Advil and Tylenol for the pain and call the office tomorrow morning to get a different pain medication.  He also stated that he had not had a BM today.  I told him that was not unusual and if he went 48 hours without having a BM he could try a laxative.

## 2013-01-04 NOTE — Telephone Encounter (Signed)
LMOM making patient aware post op appt scheduled 01/17/13 @ 4:20 pm. Patient aware to call back if this is not a good date or time.

## 2013-01-17 ENCOUNTER — Ambulatory Visit (INDEPENDENT_AMBULATORY_CARE_PROVIDER_SITE_OTHER): Payer: BC Managed Care – PPO | Admitting: General Surgery

## 2013-01-17 ENCOUNTER — Encounter (INDEPENDENT_AMBULATORY_CARE_PROVIDER_SITE_OTHER): Payer: Self-pay | Admitting: General Surgery

## 2013-01-17 VITALS — BP 120/74 | HR 68 | Temp 97.0°F | Resp 16 | Ht 70.0 in | Wt 197.2 lb

## 2013-01-17 DIAGNOSIS — Z9889 Other specified postprocedural states: Secondary | ICD-10-CM

## 2013-01-17 DIAGNOSIS — Z9049 Acquired absence of other specified parts of digestive tract: Secondary | ICD-10-CM

## 2013-01-17 NOTE — Progress Notes (Signed)
Patient ID: Thomas Dixon, male   DOB: 01-04-1976, 38 y.o.   MRN: [id number] The patient is a 37 year old male status post laparoscopic cholecystectomy secondary to biliary colic. Patient didn't do well postoperatively and no longer has right upper quadrant pain with meals. The patient is back on normal diet and having normal bowel function. He has minimal tenderness right upper quadrant.  On exam: His wounds are clean dry and intact, minimal tenderness to palpation right upper quadrant  Pathology: Reveals chronic inflammation. This was discussed with the patient.  Assessment and plan: 37 year old male status post laparoscopic cholecystectomy 1. Patient to follow up when necessary. 2. The patient and perform light duty at this time. Patient can return to full duty in 4 weeks' time.

## 2013-01-20 IMAGING — CR DG CHEST 2V
2 series · 2 of 2 positions shown · non-contrast
Comparison: Chest radiograph 03/15/2007

CLINICAL DATA: Chest pain

CHEST - 2 VIEW

[w chest pa]
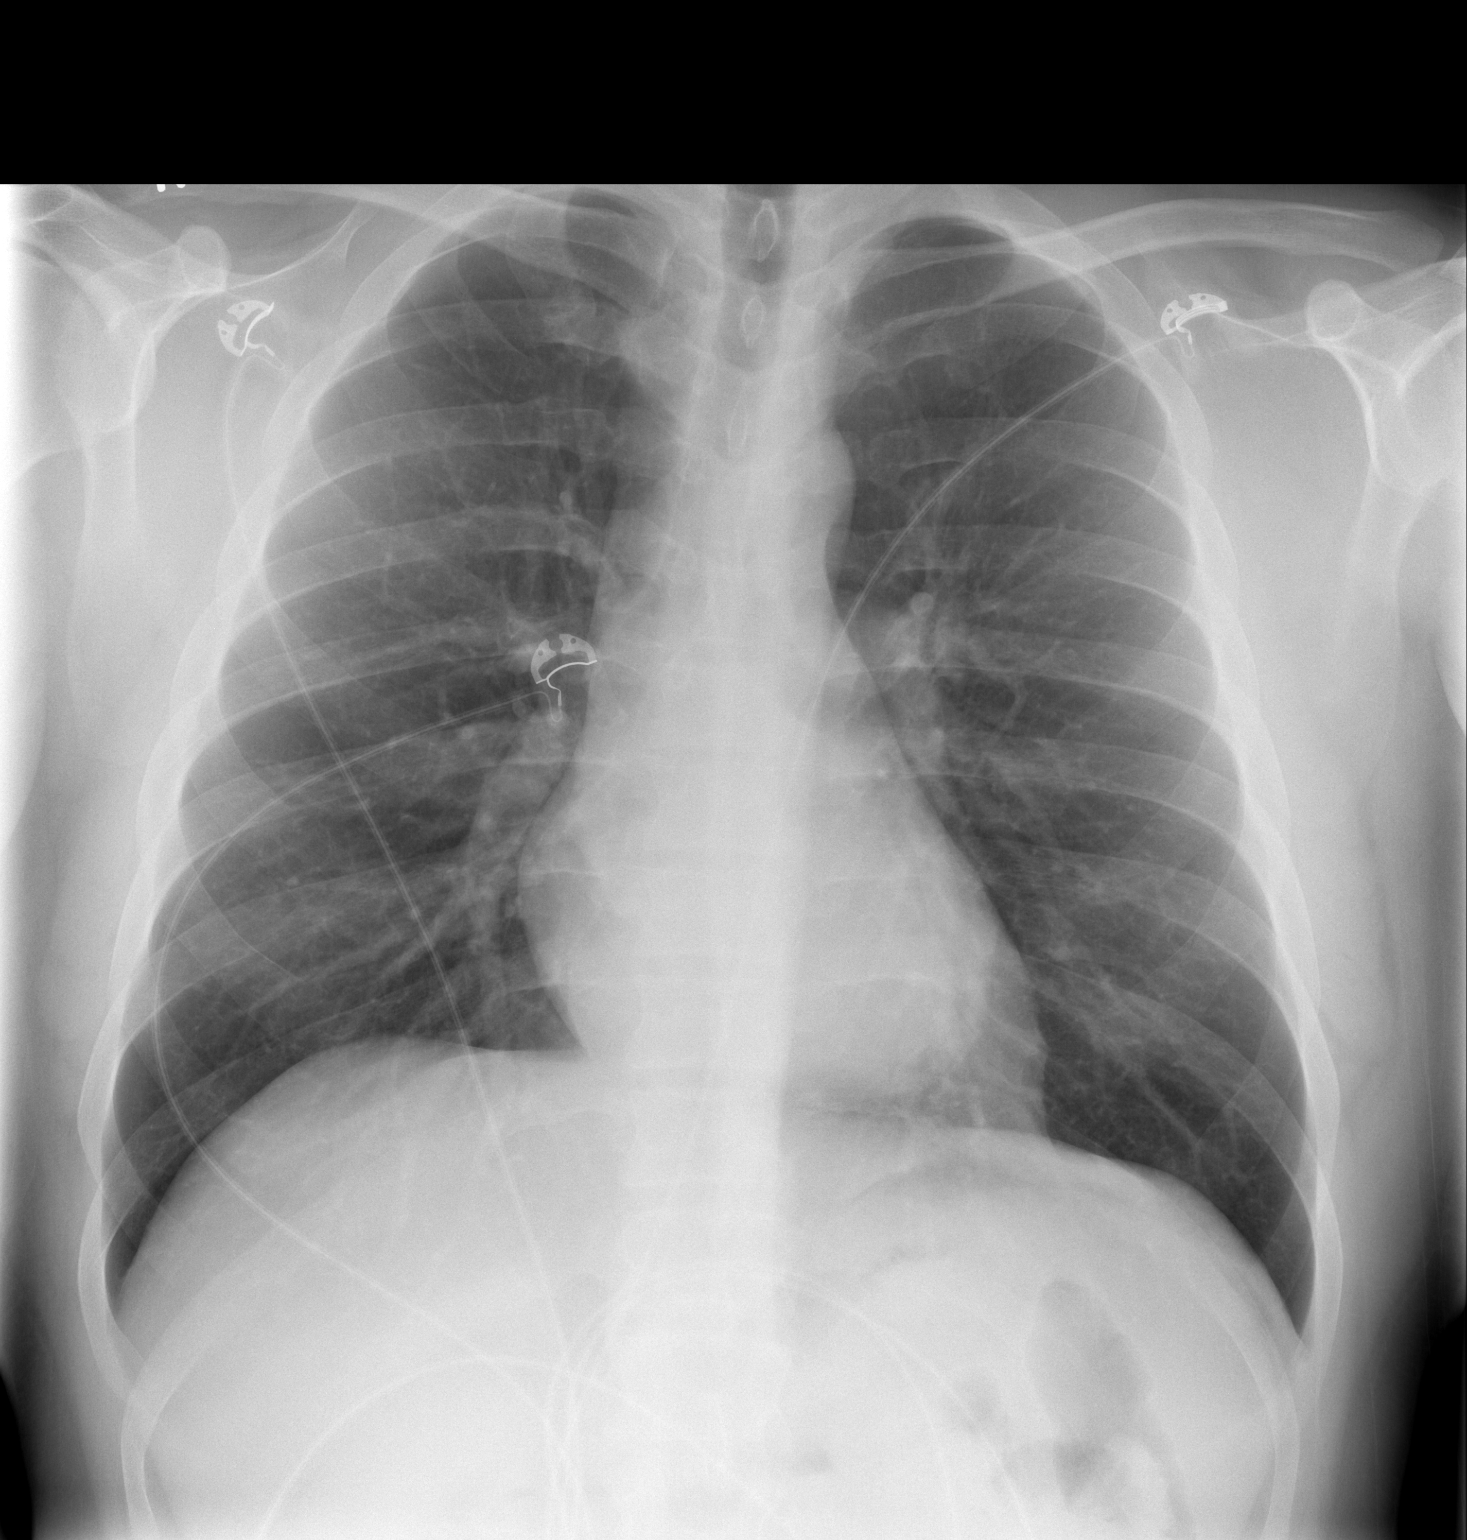

[w chest lat]
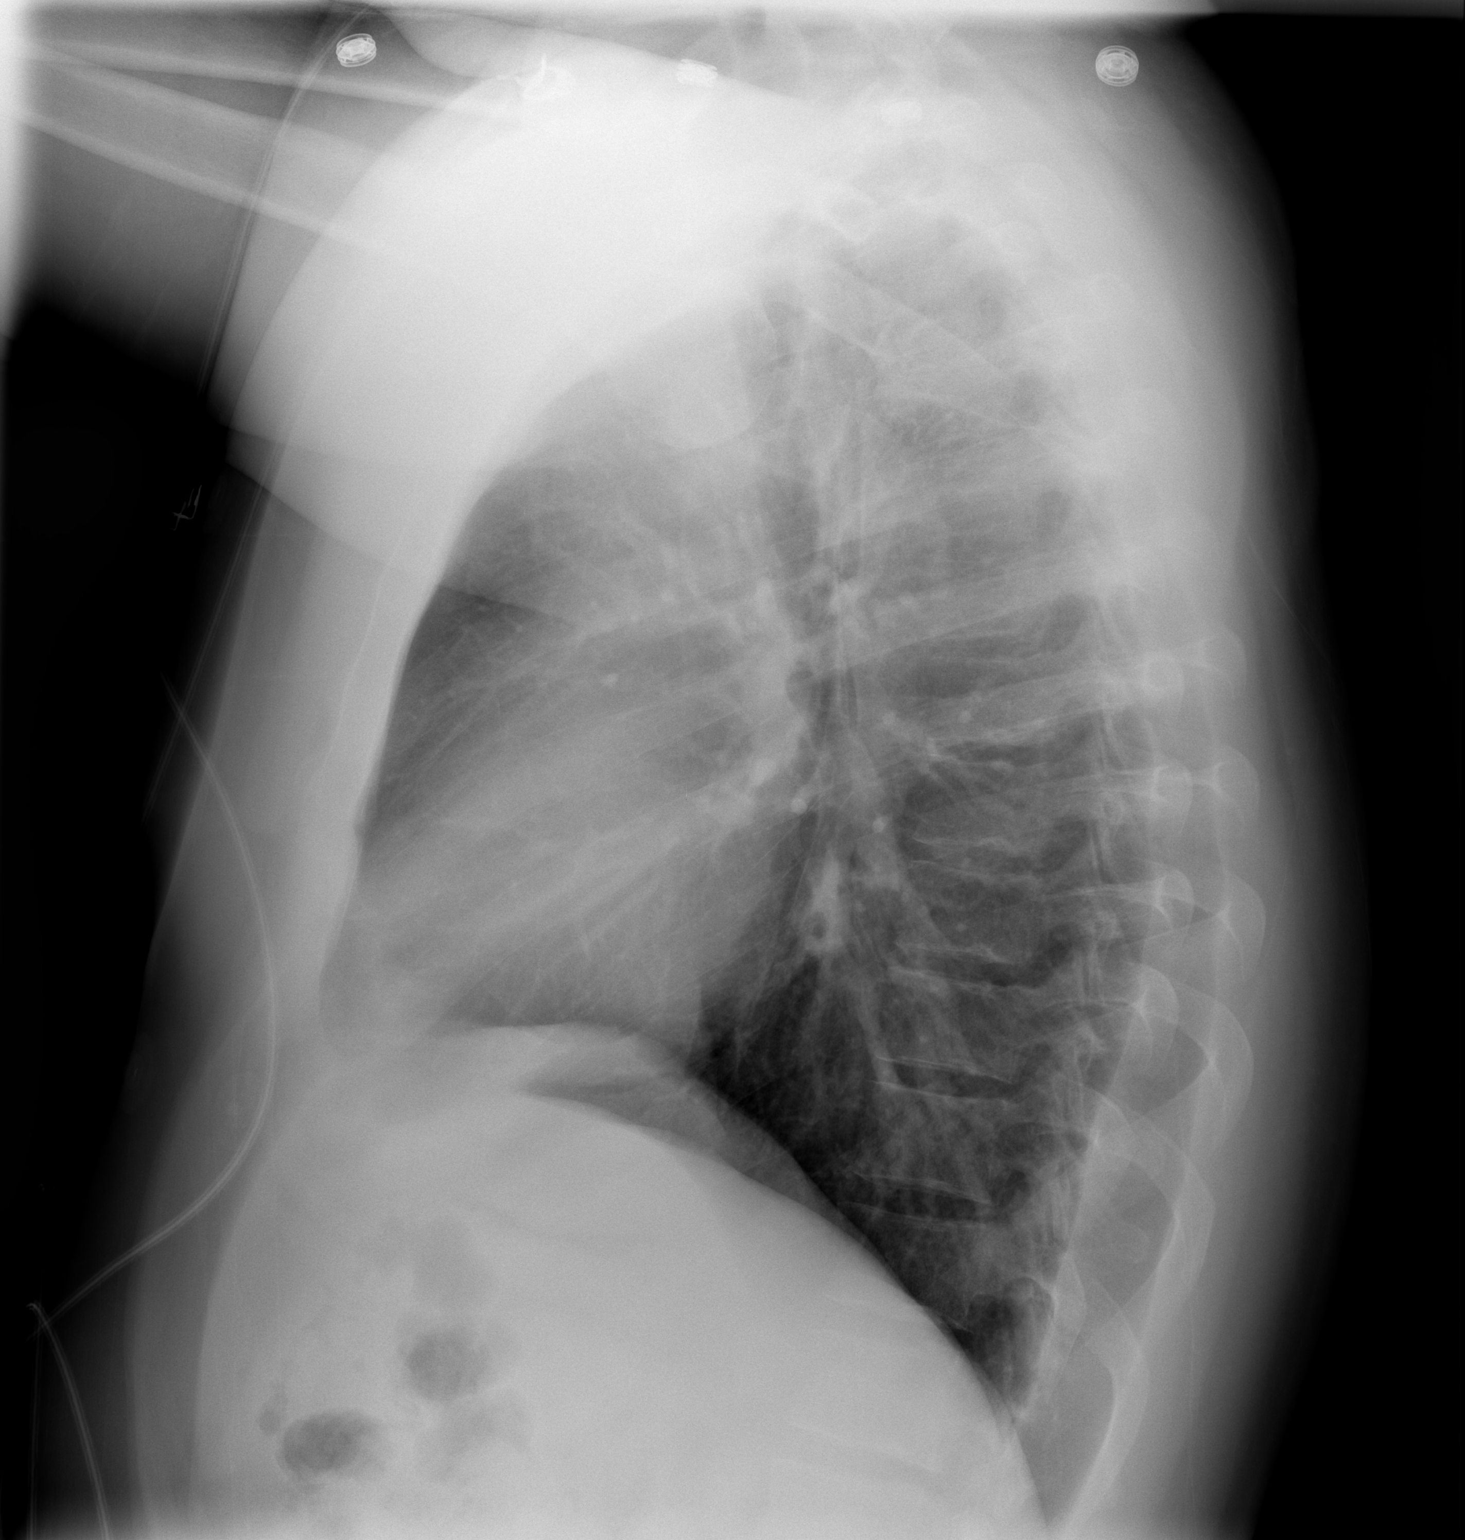

[2 of 2 positions shown; findings below may reference images not displayed]

FINDINGS: Normal mediastinum and cardiac silhouette.  Normal
pulmonary  vasculature.  No evidence of effusion, infiltrate, or
pneumothorax.  No acute bony abnormality.
IMPRESSION: No acute cardiopulmonary process.

## 2013-01-31 IMAGING — NM NM HEPATO W/GB/PHARM/[PERSON_NAME]
1 series · 12 of 12 positions shown · non-contrast
Comparison: None.

CLINICAL DATA: Right upper quadrant pain with nausea

NUCLEAR MEDICINE HEPATOBILIARY IMAGING WITH GALLBLADDER EF
TECHNIQUE: Sequential images of the abdomen were obtained [DATE] minutes following intravenous administration of
radiopharmaceutical.  After slow intravenous infusion of
micrograms Cholecystokinin, gallbladder ejection fraction was
determined.
Radiopharmaceutical:  5.5 mCi Hc-SSm Choletec

[Series 1: hepato · 4.46mm/px · 2 acquisitions, 12 frames shown]
[im 1/2]
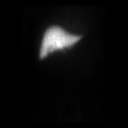
[im 1/2]
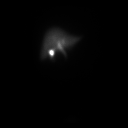
[im 1/2]
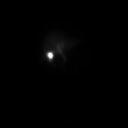
[im 1/2]
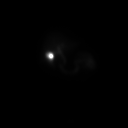
[im 1/2]
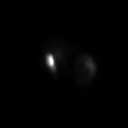
[im 1/2]
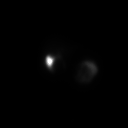
[im 2/2]
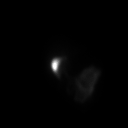
[im 2/2]
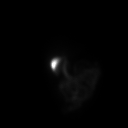
[im 2/2]
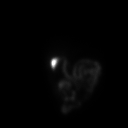
[im 2/2]
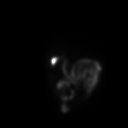
[im 2/2]
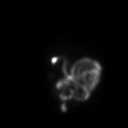
[im 2/2]
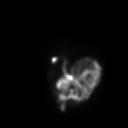

[12 of 12 positions shown; findings below may reference images not displayed]

FINDINGS: There is homogeneous uptake of radiopharmaceutical
throughout the liver.  The gallbladder is visualized by 15 minutes.
Small bowel is visualized by 25 minutes.  The gallbladder ejection
fraction is 91.8% which is normal.

The patient did experience symptoms during CCK infusion.
IMPRESSION: There is no evidence of common duct or cystic duct obstruction.

Normal gallbladder ejection fraction.

The patient experienced pain during the CCK infusion.

## 2013-02-08 ENCOUNTER — Telehealth (INDEPENDENT_AMBULATORY_CARE_PROVIDER_SITE_OTHER): Payer: Self-pay | Admitting: *Deleted

## 2013-02-08 ENCOUNTER — Encounter (INDEPENDENT_AMBULATORY_CARE_PROVIDER_SITE_OTHER): Payer: Self-pay | Admitting: *Deleted

## 2013-02-08 NOTE — Telephone Encounter (Signed)
Patient called to request his return to work note for 02/14/13.  Patient states he will come by tomorrow to pick it up at the front desk.  Dr. Jacinto Halim note states patient is released full duty on 02/14/13, patient states he work would not allow him to come back until he was full duty released.

## 2014-05-24 ENCOUNTER — Telehealth: Payer: Self-pay | Admitting: Internal Medicine

## 2014-05-24 NOTE — Telephone Encounter (Signed)
Patient has been advised. I made him aware to be sure to report increasing pain, fever, or rectal bleeding. Patient understood and had no questions.

## 2014-05-24 NOTE — Telephone Encounter (Signed)
Pt has been experiencing diarrhea for 4 days. OTC or suggestions are requested. Please advise and let pt know if OV is necessary.

## 2014-05-24 NOTE — Telephone Encounter (Signed)
Stay on clear liquids for 48-72 hours or until bowels are normal.This would include  jello, sherbert (NOT ice cream), Li[employer] chicken noodle soup(NOT cream based soups),Gatorade Lite, flat Ginger ale (without High Fructose Corn Syrup),dry toast or crackers, baked potato.No milk , dairy or grease until bowels are formed. Align , a [employer] , daily if stools are loose. Immodium AD for frankly watery stool. Report increasing pain, fever or rectal bleeding

## 2014-07-13 ENCOUNTER — Encounter (HOSPITAL_COMMUNITY): Payer: Self-pay | Admitting: Cardiology

## 2015-01-23 ENCOUNTER — Emergency Department (HOSPITAL_COMMUNITY)
Admission: EM | Admit: 2015-01-23 | Discharge: 2015-01-24 | Disposition: A | Discharge status: Home / Self Care | Payer: BLUE CROSS/BLUE SHIELD | Attending: Emergency Medicine | Admitting: Emergency Medicine

## 2015-01-23 ENCOUNTER — Encounter (HOSPITAL_COMMUNITY): Payer: Self-pay | Admitting: Emergency Medicine

## 2015-01-23 ENCOUNTER — Emergency Department (HOSPITAL_COMMUNITY): Payer: BLUE CROSS/BLUE SHIELD

## 2015-01-23 ENCOUNTER — Emergency Department (INDEPENDENT_AMBULATORY_CARE_PROVIDER_SITE_OTHER)
Admission: EM | Admit: 2015-01-23 | Discharge: 2015-01-23 | Disposition: A | Discharge status: Nursing Home (Includes ICF) | Payer: BLUE CROSS/BLUE SHIELD | Source: Home / Self Care | Attending: Family Medicine | Admitting: Family Medicine

## 2015-01-23 DIAGNOSIS — R109 Unspecified abdominal pain: Secondary | ICD-10-CM

## 2015-01-23 DIAGNOSIS — Z87891 Personal history of nicotine dependence: Secondary | ICD-10-CM | POA: Insufficient documentation

## 2015-01-23 DIAGNOSIS — N201 Calculus of ureter: Secondary | ICD-10-CM

## 2015-01-23 DIAGNOSIS — K219 Gastro-esophageal reflux disease without esophagitis: Secondary | ICD-10-CM | POA: Insufficient documentation

## 2015-01-23 DIAGNOSIS — Z9889 Other specified postprocedural states: Secondary | ICD-10-CM | POA: Insufficient documentation

## 2015-01-23 DIAGNOSIS — Z8639 Personal history of other endocrine, nutritional and metabolic disease: Secondary | ICD-10-CM | POA: Diagnosis not present

## 2015-01-23 DIAGNOSIS — I2510 Atherosclerotic heart disease of native coronary artery without angina pectoris: Secondary | ICD-10-CM | POA: Insufficient documentation

## 2015-01-23 DIAGNOSIS — N23 Unspecified renal colic: Secondary | ICD-10-CM | POA: Insufficient documentation

## 2015-01-23 DIAGNOSIS — Z7902 Long term (current) use of antithrombotics/antiplatelets: Secondary | ICD-10-CM | POA: Diagnosis not present

## 2015-01-23 DIAGNOSIS — Z7982 Long term (current) use of aspirin: Secondary | ICD-10-CM | POA: Insufficient documentation

## 2015-01-23 LAB — URINALYSIS, ROUTINE W REFLEX MICROSCOPIC
Glucose, UA: NEGATIVE mg/dL
KETONES UR: 15 mg/dL — AB
Nitrite: NEGATIVE
PROTEIN: 30 mg/dL — AB
Specific Gravity, Urine: 1.039 — ABNORMAL HIGH (ref 1.005–1.030)
Urobilinogen, UA: 1 mg/dL (ref 0.0–1.0)
pH: 6 (ref 5.0–8.0)

## 2015-01-23 LAB — URINE MICROSCOPIC-ADD ON

## 2015-01-23 LAB — I-STAT CHEM 8, ED
BUN: 15 mg/dL (ref 6–20)
CALCIUM ION: 1.17 mmol/L (ref 1.12–1.23)
Chloride: 101 mmol/L (ref 101–111)
Creatinine, Ser: 1.1 mg/dL (ref 0.61–1.24)
GLUCOSE: 86 mg/dL (ref 65–99)
HCT: 51 % (ref 39.0–52.0)
Hemoglobin: 17.3 g/dL — ABNORMAL HIGH (ref 13.0–17.0)
Potassium: 4 mmol/L (ref 3.5–5.1)
Sodium: 139 mmol/L (ref 135–145)
TCO2: 26 mmol/L (ref 0–100)

## 2015-01-23 LAB — POCT URINALYSIS DIP (DEVICE)
Bilirubin Urine: NEGATIVE
Glucose, UA: NEGATIVE mg/dL
KETONES UR: NEGATIVE mg/dL
LEUKOCYTES UA: NEGATIVE
Nitrite: NEGATIVE
PH: 6 (ref 5.0–8.0)
PROTEIN: NEGATIVE mg/dL
Specific Gravity, Urine: 1.025 (ref 1.005–1.030)
Urobilinogen, UA: 0.2 mg/dL (ref 0.0–1.0)

## 2015-01-23 MED ORDER — HYDROMORPHONE HCL 1 MG/ML IJ SOLN
1.0000 mg | Freq: Once | INTRAMUSCULAR | Status: AC
Start: 2015-01-23 — End: 2015-01-23
  Administered 2015-01-23: 1 mg via INTRAVENOUS
  Filled 2015-01-23: qty 1

## 2015-01-23 MED ORDER — HYDROMORPHONE HCL 1 MG/ML IJ SOLN
1.0000 mg | Freq: Once | INTRAMUSCULAR | Status: AC
  Administered 2015-01-23: 1 mg via INTRAVENOUS
  Filled 2015-01-23: qty 1

## 2015-01-23 MED ORDER — SODIUM CHLORIDE 0.9 % IV BOLUS (SEPSIS)
1000.0000 mL | Freq: Once | INTRAVENOUS | Status: AC
Start: 2015-01-23 — End: 2015-01-23
  Administered 2015-01-23: 1000 mL via INTRAVENOUS

## 2015-01-23 MED ORDER — HYDROCODONE-ACETAMINOPHEN 5-325 MG PO TABS: 1.0000 | ORAL_TABLET | ORAL | Status: DC | PRN

## 2015-01-23 MED ORDER — KETOROLAC TROMETHAMINE 60 MG/2ML IM SOLN
INTRAMUSCULAR | Status: AC
  Filled 2015-01-23: qty 2

## 2015-01-23 MED ORDER — KETOROLAC TROMETHAMINE 60 MG/2ML IM SOLN
60.0000 mg | Freq: Once | INTRAMUSCULAR | Status: AC
  Administered 2015-01-23: 60 mg via INTRAMUSCULAR

## 2015-01-23 NOTE — ED Provider Notes (Signed)
CSN: [id number]     Arrival date & time 01/23/15  1946 History   First MD Initiated Contact with Patient 01/23/15 2104     No chief complaint on file.    (Consider location/radiation/quality/duration/timing/severity/associated sxs/prior Treatment) HPI Complains of left flank pain radiating to groin onset 2 days ago. Feels like ureteral colic he's had in the past. Also complains of burning with urination and sense of urgency. No other associated symptoms no fever. Seen at urgent care center prior to coming here sent here for further evaluation. Nothing makes symptoms better or worse. Past Medical History  Diagnosis Date  . GERD (gastroesophageal reflux disease)   . IBS (irritable bowel syndrome)   . Gastritis   . Barrett's esophagus   . Hyperlipidemia   . Kidney stones   . Hiatal hernia     NO LONGER PRESENT WITH LAST ENDOSCOPY  . 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 Cardiac catheterization    . No past surgeries    . Cholecystectomy N/A 01/03/2013    Procedure: LAPAROSCOPIC CHOLECYSTECTOMY;  Surgeon: Ralene Ok, MD;  Location: [facility];  Service: General;  Laterality: N/A;  . Left heart catheterization with coronary angiogram N/A 09/02/2011    Procedure: LEFT HEART CATHETERIZATION WITH CORONARY ANGIOGRAM;  Surgeon: Laverda Page, MD;  Location: [facility] CATH LAB;  Service: Cardiovascular;  Laterality: N/A;   Family History  Problem Relation Age of Onset  . Colon polyps Father   . Heart disease Mother   . Heart disease Brother   . Heart disease Maternal Uncle     multiple  . Heart disease Maternal Aunt     multiple  . Heart disease Maternal Grandmother   . Pancreatic cancer Paternal Grandfather   . Colon cancer Neg Hx    History  Substance Use Topics  . Smoking status: Former Smoker -- 2.00 packs/day for 14 years    Quit date: 11/19/2003  . Smokeless tobacco: Never Used     Comment: 7 years ago as of  2012  . Alcohol Use: No    Review of Systems  Constitutional: Negative.   HENT: Negative.   Respiratory: Negative.   Cardiovascular: Negative.   Gastrointestinal: Negative.   Genitourinary: Positive for dysuria, urgency and flank pain.  Musculoskeletal: Negative.   Skin: Negative.   Neurological: Negative.   Psychiatric/Behavioral: Negative.   All other systems reviewed and are negative.     Allergies  Percocet; Simvastatin; and Rabeprazole sodium  Home Medications   Prior to Admission medications   Medication Sig Start Date End Date Taking? Authorizing Provider  aspirin EC 81 MG tablet Take 81 mg by mouth daily.    Historical Provider, MD  clopidogrel (PLAVIX) 75 MG tablet Take 75 mg by mouth daily.    Historical Provider, MD  esomeprazole (NEXIUM) 40 MG capsule Take 40 mg by mouth daily at 12 noon.    Historical Provider, MD  ezetimibe (ZETIA) 10 MG tablet Take 10 mg by mouth daily.    Historical Provider, MD  LIVALO 4 MG TABS Take 4 mg by mouth daily with supper.  11/13/12   Historical Provider, MD  nitroGLYCERIN (NITROSTAT) 0.4 MG SL tablet Place 0.4 mg under the tongue every 5 (five) minutes as needed. For chest pain    Historical Provider, MD  oxyCODONE-acetaminophen (PERCOCET) 10-325 MG per tablet Take 1 tablet by mouth every 4 (four) hours as needed for  pain. 01/03/13   Ralene Ok, MD  pantoprazole (PROTONIX) 40 MG tablet Take 40 mg by mouth daily.    Historical Provider, MD  [facility] 625 MG tablet Take 3,750 mg by mouth daily with supper.  11/09/12   Historical Provider, MD   BP 136/95 mmHg  Pulse 86  Temp(Src) 97.9 F (36.6 C) (Oral)  Resp 20  Wt 205 lb 9.6 oz (93.26 kg)  SpO2 98% Physical Exam  Constitutional: He appears well-developed and well-nourished.  HENT:  Head: Normocephalic and atraumatic.  Eyes: Conjunctivae are normal. Pupils are equal, round, and reactive to light.  Neck: Neck supple. No tracheal deviation present. No thyromegaly present.   Cardiovascular: Normal rate and regular rhythm.   No murmur heard. Pulmonary/Chest: Effort normal and breath sounds normal.  Abdominal: Soft. Bowel sounds are normal. He exhibits no distension. There is no tenderness.  Genitourinary:  Normal  Male gentialia  Musculoskeletal: Normal range of motion. He exhibits no edema or tenderness.  Neurological: He is alert. Coordination normal.  Skin: Skin is warm and dry. No rash noted.  Psychiatric: He has a normal mood and affect.  Nursing note and vitals reviewed.   ED Course  Procedures (including critical care time) Labs Review Labs Reviewed  URINALYSIS, ROUTINE W REFLEX MICROSCOPIC (NOT AT [facility])  I-STAT CHEM 8, ED    Imaging Review No results found.   EKG Interpretation None    1135 pm pain controlled after treatment with intravenous opiates. He feels ready to go home. Results for orders placed or performed during the hospital encounter of 01/23/15  Urinalysis, Routine w reflex microscopic (not at [facility])  Result Value Ref Range   Color, Urine AMBER (A) YELLOW   APPearance CLOUDY (A) CLEAR   Specific Gravity, Urine 1.039 (H) 1.005 - 1.030   pH 6.0 5.0 - 8.0   Glucose, UA NEGATIVE NEGATIVE mg/dL   Hgb urine dipstick LARGE (A) NEGATIVE   Bilirubin Urine SMALL (A) NEGATIVE   Ketones, ur 15 (A) NEGATIVE mg/dL   Protein, ur 30 (A) NEGATIVE mg/dL   Urobilinogen, UA 1.0 0.0 - 1.0 mg/dL   Nitrite NEGATIVE NEGATIVE   Leukocytes, UA TRACE (A) NEGATIVE  Urine microscopic-add on  Result Value Ref Range   WBC, UA 3-6 <3 WBC/hpf   RBC / HPF TOO NUMEROUS TO COUNT <3 RBC/hpf   Bacteria, UA RARE RARE   Urine-Other MUCOUS PRESENT   I-stat chem 8, ed  Result Value Ref Range   Sodium 139 135 - 145 mmol/L   Potassium 4.0 3.5 - 5.1 mmol/L   Chloride 101 101 - 111 mmol/L   BUN 15 6 - 20 mg/dL   Creatinine, Ser 1.10 0.61 - 1.24 mg/dL   Glucose, Bld 86 65 - 99 mg/dL   Calcium, Ion 1.17 1.12 - 1.23 mmol/L   TCO2 26 0 - 100 mmol/L    Hemoglobin 17.3 (H) 13.0 - 17.0 g/dL   HCT 51.0 39.0 - 52.0 %   Ct Abdomen Pelvis Wo Contrast  01/23/2015   CLINICAL DATA:  Patient with prior renal stones. Flank pain since Sunday.  EXAM: CT ABDOMEN AND PELVIS WITHOUT CONTRAST  TECHNIQUE: Multidetector CT imaging of the abdomen and pelvis was performed following the standard protocol without IV contrast.  COMPARISON:  CT abdomen pelvis 11/21/2011  FINDINGS: Lower chest: No consolidative pulmonary opacities. Normal heart size.  Hepatobiliary: Liver is normal in size and contour. Patient status post cholecystectomy.  Pancreas: Unremarkable  Spleen: Unremarkable  Adrenals/Urinary Tract: Normal adrenal  glands. Multiple tiny bilateral renal stones are demonstrated. Minimal dilatation of the distal left ureter. No hydronephrosis. There is a 4 mm stone at the left UVJ. Urinary bladder is decompressed.  Stomach/Bowel: No abnormal bowel wall thickening or evidence for bowel obstruction. Normal appendix. No free fluid or free intraperitoneal air.  Vascular/Lymphatic: Normal caliber abdominal aorta. No retroperitoneal lymphadenopathy.  Other: None  Musculoskeletal: No aggressive or acute appearing osseous lesions.  IMPRESSION: 4 mm stone within the left UVJ. Minimal dilatation of the distal left ureter.  Multiple additional bilateral nonobstructing renal stones.   Electronically Signed   By: Lovey Newcomer M.D.   On: 01/23/2015 21:53    MDM   plan urine sent for culture. Prescription Norco. Referral [facility]. encourage oral hydration Dx  ureteral colic Final diagnoses:  Left flank pain        Orlie Dakin, MD 01/23/15 [phone]

## 2015-01-23 NOTE — Discharge Instructions (Signed)
Kidney Stones Call [facility] tomorrow to schedule the next available appointment. Take Tylenol for mild pain or the pain medicine prescribed for bad pain . Do not take Tylenol and the pain medicine prescribed together as the combination can be dangerous Kidney stones (urolithiasis) are solid masses that form inside your kidneys. The intense pain is caused by the stone moving through the kidney, ureter, bladder, and urethra (urinary tract). When the stone moves, the ureter starts to spasm around the stone. The stone is usually passed in your pee (urine).  HOME CARE  Drink enough fluids to keep your pee clear or pale yellow. This helps to get the stone out.  Strain all pee through the provided strainer. Do not pee without peeing through the strainer, not even once. If you pee the stone out, catch it in the strainer. The stone may be as small as a grain of salt. Take this to your doctor. This will help your doctor figure out what you can do to try to prevent more kidney stones.  Only take medicine as told by your doctor.  Follow up with your doctor as told.  Get follow-up X-rays as told by your doctor. GET HELP IF: You have pain that gets worse even if you have been taking pain medicine. GET HELP RIGHT AWAY IF:   Your pain does not get better with medicine.  You have a fever or shaking chills.  Your pain increases and gets worse over 18 hours.  You have new belly (abdominal) pain.  You feel faint or pass out.  You are unable to pee. MAKE SURE YOU:   Understand these instructions.  Will watch your condition.  Will get help right away if you are not doing well or get worse. Document Released: 01/07/2008 Document Revised: 03/23/2013 Document Reviewed: 12/22/2012 [facility] Patient Information 2015 [place], [place]. This information is not intended to replace advice given to you by your health care provider. Make sure you discuss any questions you have with your health care  provider.

## 2015-01-23 NOTE — ED Notes (Signed)
Per pt went to Urgent care today, was sent here for a CT.  Reports he passed a "small stone" yesterday.  Extensive Hx of kidney stones but today felt he was close to "passing out".  C/o of burning when sitting still however goes away when he tries to urinate.  Reports "lots of blood" in urine.

## 2015-01-23 NOTE — ED Provider Notes (Addendum)
CSN: [id number]     Arrival date & time 01/23/15  1819 History   None    Chief Complaint  Patient presents with  . Nephrolithiasis   (Consider location/radiation/quality/duration/timing/severity/associated sxs/prior Treatment) Patient is a 39 y.o. male presenting with flank pain. The history is provided by the patient and the spouse.  Flank Pain This is a chronic problem. The current episode started 2 days ago (bilat flank pain on sun, passed a stone yest and right pain resolved, now with worsening left flank pain, like never before, assoc nausea.). The problem has been gradually worsening (h/o 100 stones, followed by dr Karsten Ro.). Associated symptoms include abdominal pain.    Past Medical History  Diagnosis Date  . GERD (gastroesophageal reflux disease)   . IBS (irritable bowel syndrome)   . Gastritis   . Barrett's esophagus   . Hyperlipidemia   . Kidney stones   . Hiatal hernia     NO LONGER PRESENT WITH LAST ENDOSCOPY  . 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 Cardiac catheterization    . No past surgeries    . Cholecystectomy N/A 01/03/2013    Procedure: LAPAROSCOPIC CHOLECYSTECTOMY;  Surgeon: Ralene Ok, MD;  Location: [place];  Service: General;  Laterality: N/A;  . Left heart catheterization with coronary angiogram N/A 09/02/2011    Procedure: LEFT HEART CATHETERIZATION WITH CORONARY ANGIOGRAM;  Surgeon: Laverda Page, MD;  Location: [facility] CATH LAB;  Service: Cardiovascular;  Laterality: N/A;   Family History  Problem Relation Age of Onset  . Colon polyps Father   . Heart disease Mother   . Heart disease Brother   . Heart disease Maternal Uncle     multiple  . Heart disease Maternal Aunt     multiple  . Heart disease Maternal Grandmother   . Pancreatic cancer Paternal Grandfather   . Colon cancer Neg Hx    History  Substance Use Topics  . Smoking status: Former Smoker -- 2.00 packs/day for 14  years    Quit date: 11/19/2003  . Smokeless tobacco: Never Used     Comment: 7 years ago as of 2012  . Alcohol Use: No    Review of Systems  Constitutional: Negative.   Gastrointestinal: Positive for abdominal pain.  Genitourinary: Positive for dysuria and flank pain. Negative for urgency and frequency.    Allergies  Percocet; Simvastatin; and Rabeprazole sodium  Home Medications   Prior to Admission medications   Medication Sig Start Date End Date Taking? Authorizing Provider  esomeprazole (NEXIUM) 40 MG capsule Take 40 mg by mouth daily at 12 noon.   Yes Historical Provider, MD  aspirin EC 81 MG tablet Take 81 mg by mouth daily.    Historical Provider, MD  clopidogrel (PLAVIX) 75 MG tablet Take 75 mg by mouth daily.    Historical Provider, MD  ezetimibe (ZETIA) 10 MG tablet Take 10 mg by mouth daily.    Historical Provider, MD  LIVALO 4 MG TABS Take 4 mg by mouth daily with supper.  11/13/12   Historical Provider, MD  nitroGLYCERIN (NITROSTAT) 0.4 MG SL tablet Place 0.4 mg under the tongue every 5 (five) minutes as needed. For chest pain    Historical Provider, MD  oxyCODONE-acetaminophen (PERCOCET) 10-325 MG per tablet Take 1 tablet by mouth every 4 (four) hours as needed for pain. 01/03/13   Ralene Ok, MD  pantoprazole (PROTONIX) 40 MG tablet Take  40 mg by mouth daily.    Historical Provider, MD  [facility] 625 MG tablet Take 3,750 mg by mouth daily with supper.  11/09/12   Historical Provider, MD   BP 128/86 mmHg  Pulse 92  Temp(Src) 98.1 F (36.7 C) (Oral)  Resp 18  SpO2 99% Physical Exam  Constitutional: He is oriented to person, place, and time. He appears well-developed and well-nourished. He appears distressed.  Abdominal: Soft. Normal appearance and bowel sounds are normal. He exhibits no distension and no mass. There is tenderness. There is CVA tenderness. There is no rebound and no guarding. No hernia.  Neurological: He is alert and oriented to person, place, and  time.  Skin: Skin is warm and dry.  Nursing note and vitals reviewed.   ED Course  Procedures (including critical care time) Labs Review Labs Reviewed  POCT URINALYSIS DIP (DEVICE) - Abnormal; Notable for the following:    Hgb urine dipstick LARGE (*)    All other components within normal limits    Imaging Review No results found.   MDM   1. Left ureteral stone    Sent for ct eval of left ureteral stone.    Billy Fischer, MD 01/23/15 1932  Billy Fischer, MD 01/23/15 [phone]

## 2015-01-23 NOTE — ED Notes (Signed)
Pt to CT

## 2015-01-23 NOTE — ED Notes (Signed)
Transferred from [facility] urgent care , reports left flank pain onset this week with hematuria , urinalysis done at urgent care , received Toradol with temporary relief. Denies fever or chills.

## 2015-01-23 NOTE — ED Notes (Signed)
Patient has a history of kidney stone and reports he thinks this is what is going on today.  Flank pain since Sunday 6/19.  Reports passing a small stone already on Sunday, but this pain is continuing and is increasing.  Last saw dr Karsten Ro 8-9 years ago.  Patient has had nausea and felt like passing out, but did not

## 2015-01-25 LAB — URINE CULTURE

## 2015-04-19 ENCOUNTER — Telehealth: Payer: Self-pay | Admitting: Gastroenterology

## 2015-04-19 ENCOUNTER — Encounter: Payer: Self-pay | Admitting: Gastroenterology

## 2015-04-19 NOTE — Telephone Encounter (Signed)
Spoke with patient to see if he would like to see APP since we cannot prescribe since it has been awhile since he was seen. He cannot come in afternoon only AM. Scheduled with Lori Hvozdovic, PA-C on 05/07/15 at 9:15 AM.

## 2015-05-07 ENCOUNTER — Ambulatory Visit (INDEPENDENT_AMBULATORY_CARE_PROVIDER_SITE_OTHER): Payer: BLUE CROSS/BLUE SHIELD | Admitting: Physician Assistant

## 2015-05-07 ENCOUNTER — Other Ambulatory Visit (INDEPENDENT_AMBULATORY_CARE_PROVIDER_SITE_OTHER): Payer: BLUE CROSS/BLUE SHIELD

## 2015-05-07 ENCOUNTER — Encounter: Payer: Self-pay | Admitting: Physician Assistant

## 2015-05-07 VITALS — BP 104/60 | HR 96 | Ht 69.25 in | Wt 220.2 lb

## 2015-05-07 DIAGNOSIS — R252 Cramp and spasm: Secondary | ICD-10-CM

## 2015-05-07 DIAGNOSIS — R6881 Early satiety: Secondary | ICD-10-CM

## 2015-05-07 DIAGNOSIS — R1013 Epigastric pain: Secondary | ICD-10-CM | POA: Diagnosis not present

## 2015-05-07 DIAGNOSIS — R1011 Right upper quadrant pain: Secondary | ICD-10-CM

## 2015-05-07 DIAGNOSIS — K219 Gastro-esophageal reflux disease without esophagitis: Secondary | ICD-10-CM

## 2015-05-07 DIAGNOSIS — W57XXXA Bitten or stung by nonvenomous insect and other nonvenomous arthropods, initial encounter: Secondary | ICD-10-CM

## 2015-05-07 LAB — CBC WITH DIFFERENTIAL/PLATELET
BASOS PCT: 0.3 % (ref 0.0–3.0)
Basophils Absolute: 0 10*3/uL (ref 0.0–0.1)
Eosinophils Absolute: 0.1 10*3/uL (ref 0.0–0.7)
Eosinophils Relative: 1.5 % (ref 0.0–5.0)
HCT: 47.2 % (ref 39.0–52.0)
Hemoglobin: 15.7 g/dL (ref 13.0–17.0)
LYMPHS PCT: 37.7 % (ref 12.0–46.0)
Lymphs Abs: 2.7 10*3/uL (ref 0.7–4.0)
MCHC: 33.2 g/dL (ref 30.0–36.0)
MCV: 90.1 fl (ref 78.0–100.0)
Monocytes Absolute: 0.6 10*3/uL (ref 0.1–1.0)
Monocytes Relative: 7.8 % (ref 3.0–12.0)
NEUTROS ABS: 3.8 10*3/uL (ref 1.4–7.7)
Neutrophils Relative %: 52.7 % (ref 43.0–77.0)
Platelets: 216 10*3/uL (ref 150.0–400.0)
RBC: 5.24 Mil/uL (ref 4.22–5.81)
RDW: 13.3 % (ref 11.5–15.5)
WBC: 7.2 10*3/uL (ref 4.0–10.5)

## 2015-05-07 LAB — COMPREHENSIVE METABOLIC PANEL
ALT: 16 U/L (ref 0–53)
AST: 18 U/L (ref 0–37)
Albumin: 4.4 g/dL (ref 3.5–5.2)
Alkaline Phosphatase: 59 U/L (ref 39–117)
BILIRUBIN TOTAL: 0.4 mg/dL (ref 0.2–1.2)
BUN: 10 mg/dL (ref 6–23)
CALCIUM: 9.8 mg/dL (ref 8.4–10.5)
CHLORIDE: 103 meq/L (ref 96–112)
CO2: 27 meq/L (ref 19–32)
Creatinine, Ser: 0.95 mg/dL (ref 0.40–1.50)
GFR: 94.9 mL/min (ref >60.00)
Glucose, Bld: 95 mg/dL (ref 70–99)
Potassium: 4.5 mEq/L (ref 3.5–5.1)
Sodium: 139 mEq/L (ref 135–145)
Total Protein: 7.7 g/dL (ref 6.0–8.3)

## 2015-05-07 LAB — LIPASE: Lipase: 25 U/L (ref 11.0–59.0)

## 2015-05-07 LAB — ALKALINE PHOSPHATASE: ALK PHOS: 59 U/L (ref 39–117)

## 2015-05-07 MED ORDER — ESOMEPRAZOLE MAGNESIUM 40 MG PO CPDR: 40.0000 mg | DELAYED_RELEASE_CAPSULE | Freq: Every day | ORAL | Status: DC

## 2015-05-07 NOTE — Progress Notes (Signed)
Patient ID: Thomas Dixon, male   DOB: [birth year], 39 y.o.   MRN: [id number]    HPI:  Thomas Dixon is a 39 y.o.   malereferred by Hendricks Limes, MD for evaluation of GERD. He was previously followed by Dr. Sharlett Iles. Upper endoscopy in 2007 revealed a 4 cm prolapsing hiatal hernia and Barrett's esophagus. No dysplasia was identified. Repeat EGD on 11/25/2011 showed an irregular Z line, normal duodenal folds, otherwise normal stomach. Chronic GERD. Biopsies were obtained to rule out Barrett's and showed no intestinal metaplasia. He was advised to continue his Nexium. He did this for a period of time but then when the price went up he discontinued its use. He was started on Protonix by his cardiologist, but he says protonix causes pressure in his ears so he discontinued its use. He had a cholecystectomy in 2004 which she says diminish his epigastric discomfort but didn't do anything to decrease his heartburn. He reports over the past few months he often has epigastric and right upper quadrant discomfort reminiscent of his gallbladder pain. He reports that for the past several months he feels full after 2-3 bites and feels bloated all the time he frequently belches and his burps smell like rotten eggs. He has frequent regurgitation especially when he reclines at night. He denies complaints of dysphagia. More recently he says he has been getting muscle cramps in his legs and tingling in his feet and fingers. He is concerned that his magnesium level may be off from PPI use. He also reports that he pulled a dear take off in June. He had no rash. He is concerned that his muscle aches and paresthesias may be related to Lyme disease.  He does have a long-standing history of dyslipidemia and review of his chart shows that he has had muscle cramps with a normal CK in the past. He has had a cardiac cath in January 2013 that revealed proximal and mid LAD and diffuse RCA aneurysmal dilatation. Normal LVEF. Mild  diffuse luminal irregularities.   Past Medical History  Diagnosis Date  . GERD (gastroesophageal reflux disease)   . IBS (irritable bowel syndrome)   . Gastritis   . Barrett's esophagus   . Hyperlipidemia   . Kidney stones   . Hiatal hernia     NO LONGER PRESENT WITH LAST ENDOSCOPY  . Coronary artery disease     ANEUYSM IN VEIN ON HEART  . HLD (hyperlipidemia)     Past Surgical History  Procedure Laterality Date  . Cardiac catheterization    . Cholecystectomy N/A 01/03/2013    Procedure: LAPAROSCOPIC CHOLECYSTECTOMY;  Surgeon: Ralene Ok, MD;  Location: [facility];  Service: General;  Laterality: N/A;  . Left heart catheterization with coronary angiogram N/A 09/02/2011    Procedure: LEFT HEART CATHETERIZATION WITH CORONARY ANGIOGRAM;  Surgeon: Laverda Page, MD;  [facility]: [facility] CATH LAB;  Service: Cardiovascular;  Laterality: N/A;  . Tympanostomy tube placement     Family History  Problem Relation Age of Onset  . Colon polyps Father   . Heart disease Mother   . Heart disease Brother   . Heart disease Maternal Uncle     multiple  . Heart disease Maternal Aunt     multiple  . Heart disease Maternal Grandmother   . Pancreatic cancer Paternal Grandfather   . Colon cancer Neg Hx   . Colon polyps Mother    Social History  Substance Use Topics  . Smoking status: Former Smoker -- 2.00  packs/day for 14 years    Types: Cigarettes    Quit date: 11/19/2003  . Smokeless tobacco: Never Used     Comment: 7 years ago as of 2012  . Alcohol Use: 0.0 oz/week    0 Standard drinks or equivalent per week     Comment: occas   Current Outpatient Prescriptions  Medication Sig Dispense Refill  . aspirin EC 81 MG tablet Take 81 mg by mouth daily.    . famotidine (PEPCID) 20 MG tablet Take 20 mg by mouth as needed for heartburn or indigestion.    Marland Kitchen esomeprazole (NEXIUM) 40 MG capsule Take 1 capsule (40 mg total) by mouth daily before breakfast. 30 capsule 5  . nitroGLYCERIN (NITROSTAT)  0.4 MG SL tablet Place 0.4 mg under the tongue every 5 (five) minutes as needed. For chest pain     No current facility-administered medications for this visit.   Allergies  Allergen Reactions  . Aciphex [Rabeprazole Sodium]   . Percocet [Oxycodone-Acetaminophen] Itching  . Protonix [Pantoprazole Sodium] Other (See Comments)    Ear and sinus infection, uncontrollable shaking Can take but after time do have sx's  . Zocor [Simvastatin]     REACTION: muscle aches, eye symptoms, fatigue  . Rabeprazole Sodium Rash    "aciphex" causes whelts     Review of Systems: Gen: Denies any fever, chills, sweats, anorexia, fatigue, weakness, malaise, weight loss, and sleep disorder CV: Denies chest pain, angina, palpitations, syncope, orthopnea, PND, peripheral edema, and claudication. Resp: Denies dyspnea at rest, dyspnea with exercise, cough, sputum, wheezing, coughing up blood, and pleurisy. GI: Denies vomiting blood, jaundice, and fecal incontinence.   Denies dysphagia or odynophagia. GU : Denies urinary burning, blood in urine, urinary frequency, urinary hesitancy, nocturnal urination, and urinary incontinence. MS: Denies joint pain, limitation of movement, and swelling, stiffness, low back pain, extremity pain. Denies muscle weakness,  atrophy. Reports muscle cramps and legs.  Derm: Denies rash, itching, dry skin, hives, moles, warts, or unhealing ulcers.  Psych: Denies depression, anxiety, memory loss, suicidal ideation, hallucinations, paranoia, and confusion. Heme: Denies bruising, bleeding, and enlarged lymph nodes. Neuro:  Denies any headaches, dizziness, paresthesias. Endo:  Denies any problems with DM, thyroid, adrenal function  Studies: for CT Abdomen Pelvis Wo Contrast     Study Result     CLINICAL DATA: Patient with prior renal stones. Flank pain since Sunday.  EXAM: CT ABDOMEN AND PELVIS WITHOUT CONTRAST  TECHNIQUE: Multidetector CT imaging of the abdomen and pelvis  was performed following the standard protocol without IV contrast.  COMPARISON: CT abdomen pelvis 11/21/2011  FINDINGS: Lower chest: No consolidative pulmonary opacities. Normal heart size.  Hepatobiliary: Liver is normal in size and contour. Patient status post cholecystectomy.  Pancreas: Unremarkable  Spleen: Unremarkable  Adrenals/Urinary Tract: Normal adrenal glands. Multiple tiny bilateral renal stones are demonstrated. Minimal dilatation of the distal left ureter. No hydronephrosis. There is a 4 mm stone at the left UVJ. Urinary bladder is decompressed.  Stomach/Bowel: No abnormal bowel wall thickening or evidence for bowel obstruction. Normal appendix. No free fluid or free intraperitoneal air.  Vascular/Lymphatic: Normal caliber abdominal aorta. No retroperitoneal lymphadenopathy.  Other: None  Musculoskeletal: No aggressive or acute appearing osseous lesions.  IMPRESSION: 4 mm stone within the left UVJ. Minimal dilatation of the distal left ureter.  Multiple additional bilateral nonobstructing renal stones.   Electronically Signed  By: Lovey Newcomer M.D.  On: 01/23/2015 21:53    Prior Endoscopies:   See history of present illness Physical  Exam: BP 104/60 mmHg  Pulse 96  Ht 5' 9.25" (1.759 m)  Wt 220 lb 4 oz (99.905 kg)  BMI 32.29 kg/m2 Constitutional: Pleasant,well-developed no JVD. No thyromegaly. male in no acute distress. HEENT: Normocephalic and atraumatic. Conjunctivae are normal. No scleral icterus. Neck supple.  Cardiovascular: Normal rate, regular rhythm.  Pulmonary/chest: Effort normal and breath sounds normal. No wheezing, rales or rhonchi. Abdominal: Soft, nondistended, nontender. Bowel sounds active throughout. There are no masses palpable. No hepatomegaly. Extremities: no edema Lymphadenopathy: No cervical adenopathy noted. Neurological: Alert and oriented to person place and time. Skin: Skin is warm and dry. No rashes  noted. Psychiatric: Normal mood and affect. Behavior is normal.  ASSESSMENT AND PLAN:  A 39 year old male with a long-standing history of GERD and Barrett's esophagus presenting with regurgitation, dyspepsia, early satiety, right upper quadrant pain, intermittent muscle cramping and paresthesias. His dyspepsia early satiety and regurgitation and abdominal pain may be due to some poorly controlled reflux. An antireflux regimen has been reviewed. Due to the fact that he feels he is not tolerating the pantoprazole, he will discontinue pantoprazole and try esomeprazole 40 mg 1 by mouth every morning 30 minutes prior to breakfast. A CBC, comprehensive metabolic panel, and lipase will be obtained. He will be scheduled for an abdominal ultrasound to evaluate for any possible ductal dilatation exception in a patient status post cholecystectomy. As he has been also complaining of early satiety with prolonged postprandial bloating and fullness, he will be scheduled for a gastric emptying scan. He will then be scheduled for an EGD to evaluate for esophagitis/Barrett's/gastritis/ulcer/etc.The risks, benefits, and alternatives to endoscopy with possible biopsy and possible dilation were discussed with the patient and they consent to proceed. The procedure will be scheduled with Dr. Havery Moros. As he has been complaining of muscle cramps and paresthesias, magnesium and phosphorus will be obtained as will a Lyme antibody since he has had a recent tick bite   Madline Oesterling, Vita Barley PA-C 05/07/2015, 12:24 PM  CC: Hendricks Limes, MD

## 2015-05-07 NOTE — Progress Notes (Signed)
Agree with assessment and plan as outlined.  

## 2015-05-07 NOTE — Patient Instructions (Signed)
Discontinue pantoprazole.  We have sent the following medications to your pharmacy for you to pick up at your convenience:Nexium for you to take one capsule by mouth 30 minutes before breakfast.  Your physician has requested that you go to the basement for lab work before leaving today.  You have been scheduled for an abdominal ultrasound at [facility] Radiology (1st floor of hospital) on 05/14/15 at 9:30am. Please arrive 15 minutes prior to your appointment for registration. Make certain not to have anything to eat or drink 6 hours prior to your appointment. Should you need to reschedule your appointment, please contact radiology at [phone]. This test typically takes about 30 minutes to perform.  You have been scheduled for a gastric emptying scan at [facility]iology on 05/22/15 at 9:30am. Please arrive at least 15 minutes prior to your appointment for registration. Please make certain not to have anything to eat or drink after midnight the night before your test. Hold all stomach medications (ex: Zofran, phenergan, Reglan, nexium) 48 hours prior to your test. If you need to reschedule your appointment, please contact radiology scheduling at [phone]. _____________________________________________________________________ A gastric-emptying study measures how long it takes for food to move through your stomach. There are several ways to measure stomach emptying. In the most common test, you eat food that contains a small amount of radioactive material. A scanner that detects the movement of the radioactive material is placed over your abdomen to monitor the rate at which food leaves your stomach. This test normally takes about 4 hours to complete. _____________________________________________________________________   Dennis Bast have been scheduled for an endoscopy. Please follow written instructions given to you at your visit today. If you use inhalers (even only as needed), please bring them  with you on the day of your procedure.

## 2015-05-08 LAB — LYME AB/WESTERN BLOT REFLEX: B burgdorferi Ab IgG+IgM: 0.32 {ISR}

## 2015-05-11 ENCOUNTER — Telehealth: Payer: Self-pay | Admitting: Physician Assistant

## 2015-05-11 NOTE — Telephone Encounter (Signed)
Left a message for patient to call back. 

## 2015-05-11 NOTE — Telephone Encounter (Signed)
Spoke with patient and he states he cancelled the GES and US abdomen because he cannot afford these procedures. He states he is 90% better after taking the generic Nexium.

## 2015-05-14 ENCOUNTER — Ambulatory Visit (HOSPITAL_COMMUNITY): Payer: BLUE CROSS/BLUE SHIELD

## 2015-05-22 ENCOUNTER — Encounter (HOSPITAL_COMMUNITY): Payer: BLUE CROSS/BLUE SHIELD

## 2015-06-08 ENCOUNTER — Ambulatory Visit: Payer: BLUE CROSS/BLUE SHIELD | Admitting: Gastroenterology

## 2015-07-12 ENCOUNTER — Encounter: Payer: BLUE CROSS/BLUE SHIELD | Admitting: Gastroenterology

## 2015-07-23 ENCOUNTER — Encounter: Payer: Self-pay | Admitting: Gastroenterology

## 2015-07-23 ENCOUNTER — Ambulatory Visit (AMBULATORY_SURGERY_CENTER): Payer: BLUE CROSS/BLUE SHIELD | Admitting: Gastroenterology

## 2015-07-23 VITALS — BP 113/71 | HR 75 | Temp 97.4°F | Resp 22 | Ht 69.0 in | Wt 220.0 lb

## 2015-07-23 DIAGNOSIS — R1013 Epigastric pain: Secondary | ICD-10-CM

## 2015-07-23 DIAGNOSIS — K219 Gastro-esophageal reflux disease without esophagitis: Secondary | ICD-10-CM

## 2015-07-23 MED ORDER — ESOMEPRAZOLE MAGNESIUM 40 MG PO PACK: 40.0000 mg | PACK | Freq: Two times a day (BID) | ORAL | Status: DC

## 2015-07-23 MED ORDER — SODIUM CHLORIDE 0.9 % IV SOLN: 500.0000 mL | INTRAVENOUS | Status: DC

## 2015-07-23 NOTE — Progress Notes (Signed)
Called to room to assist during endoscopic procedure.  Patient ID and intended procedure confirmed with present staff. Received instructions for my participation in the procedure from the performing physician.  

## 2015-07-23 NOTE — Progress Notes (Signed)
To PACU pt awake and alert, pleased with MAC, Report to RN

## 2015-07-23 NOTE — Patient Instructions (Signed)
Biopsies taken today. Result letter will come in your mail in about 2 weeks. Take Nexium 1 pill twice a day for a few weeks. (add evening dose)  YOU HAD AN ENDOSCOPIC PROCEDURE TODAY AT [facility]:   Refer to the procedure report that was given to you for any specific questions about what was found during the examination.  If the procedure report does not answer your questions, please call your gastroenterologist to clarify.  If you requested that your care partner not be given the details of your procedure findings, then the procedure report has been included in a sealed envelope for you to review at your convenience later.  YOU SHOULD EXPECT: Some feelings of bloating in the abdomen. Passage of more gas than usual.  Walking can help get rid of the air that was put into your GI tract during the procedure and reduce the bloating. If you had a lower endoscopy (such as a colonoscopy or flexible sigmoidoscopy) you may notice spotting of blood in your stool or on the toilet paper. If you underwent a bowel prep for your procedure, you may not have a normal bowel movement for a few days.  Please Note:  You might notice some irritation and congestion in your nose or some drainage.  This is from the oxygen used during your procedure.  There is no need for concern and it should clear up in a day or so.  SYMPTOMS TO REPORT IMMEDIATELY:   Following upper endoscopy (EGD)  Vomiting of blood or coffee ground material  New chest pain or pain under the shoulder blades  Painful or persistently difficult swallowing  New shortness of breath  Fever of 100F or higher  Black, tarry-looking stools  For urgent or emergent issues, a gastroenterologist can be reached at any hour by calling [phone].   DIET: Your first meal following the procedure should be a small meal and then it is ok to progress to your normal diet. Heavy or fried foods are harder to digest and may make you feel nauseous or  bloated.  Likewise, meals heavy in dairy and vegetables can increase bloating.  Drink plenty of fluids but you should avoid alcoholic beverages for 24 hours.  ACTIVITY:  You should plan to take it easy for the rest of today and you should NOT DRIVE or use heavy machinery until tomorrow (because of the sedation medicines used during the test).    FOLLOW UP: Our staff will call the number listed on your records the next business day following your procedure to check on you and address any questions or concerns that you may have regarding the information given to you following your procedure. If we do not reach you, we will leave a message.  However, if you are feeling well and you are not experiencing any problems, there is no need to return our call.  We will assume that you have returned to your regular daily activities without incident.  If any biopsies were taken you will be contacted by phone or by letter within the next 1-3 weeks.  Please call us at [phone] if you have not heard about the biopsies in 3 weeks.    SIGNATURES/CONFIDENTIALITY: You and/or your care partner have signed paperwork which will be entered into your electronic medical record.  These signatures attest to the fact that that the information above on your After Visit Summary has been reviewed and is understood.  Full responsibility of the confidentiality of this  discharge information lies with you and/or your care-partner. 

## 2015-07-23 NOTE — Op Note (Signed)
[place]  [address] [place], [address]   ENDOSCOPY PROCEDURE REPORT  PATIENT: Thomas Dixon, Thomas Dixon  MR#: [id number] BIRTHDATE: [birth year] , 39  yrs. old GENDER: male ENDOSCOPIST: Yetta Flock, MD REFERRED BY: PROCEDURE DATE:  07/23/2015 PROCEDURE:  EGD w/ biopsy ASA CLASS:     Class II INDICATIONS:  dyspepsia and nocturnal epigastric pain, reported history of short segment Barrett's. MEDICATIONS: Propofol 250 mg IV TOPICAL ANESTHETIC:  DESCRIPTION OF PROCEDURE: After the risks benefits and alternatives of the procedure were thoroughly explained, informed consent was obtained.  The LB [id number] endoscope was introduced through the mouth and advanced to the second portion of the duodenum , Without limitations.  The instrument was slowly withdrawn as the mucosa was fully examined.    FINDINGS: The esophagus was normal.  DH and GEJ located 40cm from the incisors.  The SCJ was irregular with a roughly 1cm or less segment of salmon colored mucosa extending proximally from the GEJ. No nodularity was appreciated.  Biopsies were taken to rule out Barrett's.  The examined stomach was normal without ulcerations or inflammatory changes.  Biopsies were taken to rule out H pylori. The duodenal bulb and 2nd portion of the duodenum were normal. Retroflexed views revealed no abnormalities.     The scope was then withdrawn from the patient and the procedure completed.  COMPLICATIONS: There were no immediate complications.  ENDOSCOPIC IMPRESSION: Possible 1cm or less segment of Barrett's. Biopsies obtained Normal stomach, biopsies obtained to rule out H pylori Normal duodenum  RECOMMENDATIONS: Await pathology results Resume diet Resume medications Trial of nexium twice daily for  few weeks, with addition of evening dose, to see if this helps prevent nocturnal symptoms (suspect could be related to nocturnal reflux)  eSigned:  Yetta Flock,  MD 07/23/2015 3:06 PM   CC: the patient

## 2015-07-24 ENCOUNTER — Telehealth: Payer: Self-pay

## 2015-07-24 MED ORDER — ESOMEPRAZOLE MAGNESIUM 40 MG PO CPDR: 40.0000 mg | DELAYED_RELEASE_CAPSULE | Freq: Every day | ORAL | Status: DC

## 2015-07-24 NOTE — Telephone Encounter (Signed)
If it was a packet it is an error. Can you please switch it to pills and I apologize for the error. It should be for twice daily pill of nexium at the same dose. Thanks

## 2015-07-24 NOTE — Telephone Encounter (Signed)
Dr. Havery Moros, Pt had an EGD on 07/23/2015. On this day Nexium packets were sent to pt's pharmacy. His pharmacy faxed over a note stating that the pt said he usually takes pills. Was this supposed to be called in as a packet or capsule?

## 2015-07-24 NOTE — Telephone Encounter (Signed)
Left message on answering machine. 

## 2015-07-27 ENCOUNTER — Other Ambulatory Visit: Payer: Self-pay | Admitting: *Deleted

## 2015-07-27 ENCOUNTER — Telehealth: Payer: Self-pay | Admitting: Gastroenterology

## 2015-07-27 MED ORDER — ESOMEPRAZOLE MAGNESIUM 40 MG PO CPDR: DELAYED_RELEASE_CAPSULE | ORAL | Status: DC

## 2015-07-27 NOTE — Telephone Encounter (Signed)
Left vm for pt to callback 

## 2015-08-01 ENCOUNTER — Other Ambulatory Visit: Payer: Self-pay

## 2015-08-01 MED ORDER — ESOMEPRAZOLE MAGNESIUM 40 MG PO CPDR: DELAYED_RELEASE_CAPSULE | ORAL | Status: DC

## 2015-08-01 NOTE — Telephone Encounter (Signed)
Left vm for pt to callback 

## 2015-09-06 ENCOUNTER — Telehealth: Payer: Self-pay | Admitting: Gastroenterology

## 2015-09-06 NOTE — Telephone Encounter (Signed)
Spoke with patient and he states the generic Nexium is helping his GERD but he has had diarrhea 1-2/daily for the last 2 months. He saw his PCP and was given antibiotics and had a negative stool test. States he has tried probiotics also.He thought it would go away but it is not. Reports he had diarrhea when he took Zegerid in the past. He is aware the MD will not be back in our office until 09/11/15. Please, advise.

## 2015-09-10 NOTE — Telephone Encounter (Signed)
Left a message for patient to call back. 

## 2015-09-10 NOTE — Telephone Encounter (Signed)
Patient given recommendations. 

## 2015-09-10 NOTE — Telephone Encounter (Signed)
If he thinks it is medication related, tell him to hold the nexium for up to 1-2 weeks to see this resolves his diarrhea. If it does, then it would be consistent with a medication reaction and we can try something else. If his symptoms persist despite holding nexium then will need to consider something else. HE can try immodium for the diarrhea and try zantac for GERD in the interim. If he wants to be seen in clinic to discuss further that's fine. Thanks

## 2016-07-30 ENCOUNTER — Telehealth: Payer: Self-pay | Admitting: Gastroenterology

## 2016-07-30 ENCOUNTER — Telehealth: Payer: Self-pay

## 2016-07-30 NOTE — Telephone Encounter (Signed)
Yes that's fine. I should see him in follow up as some point as well to reassess his condition. Thanks

## 2016-07-30 NOTE — Telephone Encounter (Signed)
Routed to Ashley. 

## 2016-07-30 NOTE — Telephone Encounter (Signed)
Dr Havery Moros, Are you ok refilling this pts Nexium? He has not been seen in the office since 05/2015.

## 2016-07-31 ENCOUNTER — Other Ambulatory Visit: Payer: Self-pay

## 2016-07-31 MED ORDER — ESOMEPRAZOLE MAGNESIUM 40 MG PO CPDR: DELAYED_RELEASE_CAPSULE | ORAL | 3 refills | Status: DC

## 2016-07-31 NOTE — Telephone Encounter (Signed)
done

## 2017-08-05 ENCOUNTER — Telehealth: Payer: Self-pay | Admitting: Gastroenterology

## 2017-08-05 NOTE — Telephone Encounter (Signed)
Called pt and let him know he needs an OV to discuss changing medication.  He has not been seen since 2016.  He can only come at the 8:30am slot due to his work schedule.  Dr. Loni Muse does not have 8:30 slot until mid February. Scheduled pt to see Amy Esterwood next Friday 08-14-17 at 8:30am.

## 2017-08-05 NOTE — Telephone Encounter (Signed)
Patient states that ins no longer covers generic nexium and is requesting a new prescription of generic protonix which is now covered. Pt uses [facility]process development [facility].

## 2017-08-14 ENCOUNTER — Ambulatory Visit (INDEPENDENT_AMBULATORY_CARE_PROVIDER_SITE_OTHER): Payer: Managed Care, Other (non HMO) | Admitting: Physician Assistant

## 2017-08-14 ENCOUNTER — Encounter (INDEPENDENT_AMBULATORY_CARE_PROVIDER_SITE_OTHER): Payer: Self-pay

## 2017-08-14 ENCOUNTER — Encounter: Payer: Self-pay | Admitting: Physician Assistant

## 2017-08-14 VITALS — BP 120/72 | HR 80 | Ht 70.0 in | Wt 218.0 lb

## 2017-08-14 DIAGNOSIS — K219 Gastro-esophageal reflux disease without esophagitis: Secondary | ICD-10-CM | POA: Diagnosis not present

## 2017-08-14 DIAGNOSIS — K2270 Barrett's esophagus without dysplasia: Secondary | ICD-10-CM

## 2017-08-14 MED ORDER — PANTOPRAZOLE SODIUM 40 MG PO TBEC: DELAYED_RELEASE_TABLET | ORAL | 11 refills | Status: DC

## 2017-08-14 NOTE — Progress Notes (Signed)
Agree with assessment and plan as outlined.  

## 2017-08-14 NOTE — Patient Instructions (Addendum)
We have sent the following medications to your pharmacy for you to pick up at your convenience: [address], [place], [place].  You will be due for Endoscopy with Dr. Havery Moros 07/2019.

## 2017-08-14 NOTE — Progress Notes (Signed)
Subjective:    Patient ID: Thomas Dixon, male    DOB: [birth year], 42 y.o.   MRN: [id number]  HPI Ravinder is a pleasant 42 year old white male, known to Dr. Havery Moros with history of chronic GERD and short segment Barrett's who comes in today for change of medication. He was last seen in December 2016 at which time he had upper endoscopy. He was found to have a stable short segment nondysplastic Barrett's and follow-up was advised in 3-5 years. H. pylori was negative. Patient has been on Nexium over the past few years which she says is working very well to control his chronic reflux symptoms. He says if he stops the medicine for a day or 2 he will have recurrence of heartburn and indigestion fairly quickly. He received notification from his insurance company that they would no longer cover Nexium and apparently pantoprazole is preferred. He has no other concerns today. No complaints of dysphagia or odynophagia. No abdominal pain, no changes in bowel habits.  Review of Systems Pertinent positive and negative review of systems were noted in the above HPI section.  All other review of systems was otherwise negative.  Outpatient Encounter Medications as of 08/14/2017  Medication Sig  . aspirin EC 81 MG tablet Take 81 mg by mouth daily.  Marland Kitchen loratadine (CLARITIN) 10 MG tablet Take 10 mg by mouth daily.  . mometasone (NASONEX) 50 MCG/ACT nasal spray Place 2 sprays into the nose daily.  . nitroGLYCERIN (NITROSTAT) 0.4 MG SL tablet Place 0.4 mg under the tongue every 5 (five) minutes as needed. For chest pain  . pantoprazole (PROTONIX) 40 MG tablet Take 1 tablet by mouth with breakfast.  . [DISCONTINUED] pantoprazole (PROTONIX) 40 MG tablet Take 40 mg by mouth daily.  . [DISCONTINUED] esomeprazole (NEXIUM) 40 MG capsule Take one tablet daily.  . [DISCONTINUED] famotidine (PEPCID) 20 MG tablet Take 20 mg by mouth as needed for heartburn or indigestion.   No facility-administered encounter  medications on file as of 08/14/2017.    Allergies  Allergen Reactions  . Aciphex [Rabeprazole Sodium]   . Percocet [Oxycodone-Acetaminophen] Itching  . Zocor [Simvastatin]     REACTION: muscle aches, eye symptoms, fatigue  . Rabeprazole Sodium Rash    "aciphex" causes whelts   Patient Active Problem List   Diagnosis Date Noted  . 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 Hyperlipidemia 09/01/2011  . ARTHRALGIA 08/01/2010  . POLYARTHRITIS 07/10/2010  . FATIGUE 07/10/2010  . MUSCLE FASCICULATIONS 07/10/2010  . ATTENTION OR CONCENTRATION DEFICIT 07/10/2010  . RHINITIS 01/22/2010  . HYPERLIPIDEMIA 01/01/2010  . DIZZINESS 01/01/2010  . JAW PAIN 01/01/2010  . CHEST PAIN 01/01/2010  . MYALGIA 06/07/2008  . GERD 11/17/2007  . IBS 11/17/2007  . BARRETTS ESOPHAGUS 05/15/2006   Social History   Socioeconomic History  . Marital status: Married    Spouse name: Not on file  . Number of children: 3  . Years of education: Not on file  . Highest education level: Not on file  Social Needs  . Financial resource strain: Not on file  . Food insecurity - worry: Not on file  . Food insecurity - inability: Not on file  . Transportation needs - medical: Not on file  . Transportation needs - non-medical: Not on file  Occupational History  . Occupation: Magazine features editor: [employer]  Tobacco Use  . Smoking status: Former Smoker    Packs/day: 2.00    Years: 14.00  Pack years: 28.00    Types: Cigarettes    Last attempt to quit: 11/19/2003    Years since quitting: 13.7  . Smokeless tobacco: Current User    Types: Chew  . Tobacco comment: 7 years ago as of 2012  Substance and Sexual Activity  . Alcohol use: Yes    Alcohol/week: 0.0 oz    Comment: occas  . Drug use: No  . Sexual activity: Not on file  Other Topics Concern  . Not on file  Social History Narrative  . Not on file    Mr. Kreuser's family history includes Colon polyps in his father and mother;  Heart disease in his brother, maternal aunt, maternal grandmother, maternal uncle, and mother; Pancreatic cancer in his paternal grandfather.      Objective:    Vitals:   08/14/17 0835  BP: 120/72  Pulse: 80    Physical Exam  ; well-developed young white male in no acute distress, pleasant blood pressure 120/72 pulse 80, height 5 foot 10, weight 218, BMI 31.2. HEENT nontraumatic normocephalic EOMI PERRLA sclera anicteric, not further examined today discussion only      Assessment & Plan:   #11 42 year old white male with chronic GERD and stable short segment nondysplastic Barrett's, last EGD December 2016 Patient has been doing well, insurance will no longer cover Nexium and he is requesting switch to pantoprazole. #2 IBS  Plan; continue anti-reflex regimen. Prescription was given for Pantoprazole 40 mg by mouth every morning before meals breakfast with 11 refills. We discussed follow-up EGD around December 2020 with Dr. Havery Moros. Patient will follow up in the interim on an as-needed basis.  Sonda Coppens Genia Harold PA-C 08/14/2017   Cc: Hendricks Limes, MD

## 2018-08-16 ENCOUNTER — Telehealth: Payer: Self-pay | Admitting: Physician Assistant

## 2018-08-16 NOTE — Telephone Encounter (Signed)
Pt called in to get medication refill pt is sched to see Nicoletta Ba 08/23/2018 @8 :30am

## 2018-08-17 ENCOUNTER — Other Ambulatory Visit: Payer: Self-pay | Admitting: *Deleted

## 2018-08-17 MED ORDER — PANTOPRAZOLE SODIUM 40 MG PO TBEC
DELAYED_RELEASE_TABLET | ORAL | 1 refills | Status: DC
Start: 2018-08-17 — End: 2018-08-23

## 2018-08-17 NOTE — Telephone Encounter (Signed)
Informed patient to keep the appointment with [facility], advised we sent a refill for the Pantoprazole sodium 40 mg. The patient said he will be here for the appointment.

## 2018-08-23 ENCOUNTER — Ambulatory Visit (INDEPENDENT_AMBULATORY_CARE_PROVIDER_SITE_OTHER): Payer: Managed Care, Other (non HMO) | Admitting: Physician Assistant

## 2018-08-23 ENCOUNTER — Encounter (INDEPENDENT_AMBULATORY_CARE_PROVIDER_SITE_OTHER): Payer: Self-pay

## 2018-08-23 ENCOUNTER — Encounter: Payer: Self-pay | Admitting: Physician Assistant

## 2018-08-23 VITALS — BP 118/80 | HR 76 | Ht 70.0 in | Wt 226.2 lb

## 2018-08-23 DIAGNOSIS — K2270 Barrett's esophagus without dysplasia: Secondary | ICD-10-CM

## 2018-08-23 DIAGNOSIS — K2100 Gastro-esophageal reflux disease with esophagitis, without bleeding: Secondary | ICD-10-CM

## 2018-08-23 MED ORDER — PANTOPRAZOLE SODIUM 40 MG PO TBEC: DELAYED_RELEASE_TABLET | ORAL | 11 refills | Status: DC

## 2018-08-23 NOTE — Progress Notes (Signed)
Agree with assessment and plan as outlined.  

## 2018-08-23 NOTE — Patient Instructions (Addendum)
We sent refills for the Pantoprazole sodium 40 mg to [facility], [place].   Normal BMI (Body Mass Index- based on height and weight) is between 19 and 25. Your BMI today is Body mass index is 32.46 kg/m. Marland Kitchen Please consider follow up  regarding your BMI with your Primary Care Provider.

## 2018-08-23 NOTE — Progress Notes (Signed)
Subjective:    Patient ID: Thomas Dixon, male    DOB: 01-Dec-1975, 43 y.o.   MRN: [id number]  HPI Thomas Dixon is a pleasant 43 year old white male, known to Thomas Dixon and myself.  He has history of chronic GERD and short segment Barrett's esophagus.  He comes in today for med refills, and questions when he is supposed to have another endoscopy. Patient last had EGD in December 2016 and was found to have short segment of Barrett's.  Exam was otherwise unremarkable.  Path showed Barrett's esophagus with no dysplasia. Patient has been maintained on pantoprazole 40 mg p.o. every morning, and says he does well as long as he does not skip the medication.  He denies any issues with breakthrough symptoms.  Denies any dysphagia or odynophagia.  No complaints of abdominal pain changes in bowel habits melena or hematochezia.  Review of Systems Pertinent positive and negative review of systems were noted in the above HPI section.  All other review of systems was otherwise negative.  Outpatient Encounter Medications as of 08/23/2018  Medication Sig  . aspirin EC 81 MG tablet Take 81 mg by mouth daily.  Marland Kitchen loratadine (CLARITIN) 10 MG tablet Take 10 mg by mouth daily.  . mometasone (NASONEX) 50 MCG/ACT nasal spray Place 2 sprays into the nose daily.  . nitroGLYCERIN (NITROSTAT) 0.4 MG SL tablet Place 0.4 mg under the tongue every 5 (five) minutes as needed. For chest pain  . pantoprazole (PROTONIX) 40 MG tablet Take 1 tablet by mouth with breakfast.  . [DISCONTINUED] pantoprazole (PROTONIX) 40 MG tablet Take 1 tablet by mouth with breakfast.   No facility-administered encounter medications on file as of 08/23/2018.    Allergies  Allergen Reactions  . Aciphex [Rabeprazole Sodium]   . Percocet [Oxycodone-Acetaminophen] Itching  . Zocor [Simvastatin]     REACTION: muscle aches, eye symptoms, fatigue  . Rabeprazole Sodium Rash    "aciphex" causes whelts   Patient Active Problem List   Diagnosis  Date Noted  . 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 Hyperlipidemia 09/01/2011  . ARTHRALGIA 08/01/2010  . POLYARTHRITIS 07/10/2010  . FATIGUE 07/10/2010  . MUSCLE FASCICULATIONS 07/10/2010  . ATTENTION OR CONCENTRATION DEFICIT 07/10/2010  . RHINITIS 01/22/2010  . HYPERLIPIDEMIA 01/01/2010  . DIZZINESS 01/01/2010  . JAW PAIN 01/01/2010  . CHEST PAIN 01/01/2010  . MYALGIA 06/07/2008  . GERD 11/17/2007  . IBS 11/17/2007  . BARRETTS ESOPHAGUS 05/15/2006   Social History   Socioeconomic History  . Marital status: Married    Spouse name: Not on file  . Number of children: 3  . Years of education: Not on file  . Highest education level: Not on file  Occupational History  . Occupation: Magazine features editor: [employer]  . Financial resource strain: Not on file  . Food insecurity:    Worry: Not on file    Inability: Not on file  . Transportation needs:    Medical: Not on file    Non-medical: Not on file  Tobacco Use  . Smoking status: Former Smoker    Packs/day: 2.00    Years: 14.00    Pack years: 28.00    Types: Cigarettes    Last attempt to quit: 11/19/2003    Years since quitting: 14.7  . Smokeless tobacco: Current User    Types: Chew  . Tobacco comment: 7 years ago as of 2012  Substance and Sexual Activity  . Alcohol use: Yes  Alcohol/week: 0.0 standard drinks    Comment: occas  . Drug use: No  . Sexual activity: Not on file  Lifestyle  . Physical activity:    Days per week: Not on file    Minutes per session: Not on file  . Stress: Not on file  Relationships  . Social connections:    Talks on phone: Not on file    Gets together: Not on file    Attends religious service: Not on file    Active member of club or organization: Not on file    Attends meetings of clubs or organizations: Not on file    Relationship status: Not on file  . Intimate partner violence:    Fear of current or ex partner: Not on file    Emotionally  abused: Not on file    Physically abused: Not on file    Forced sexual activity: Not on file  Other Topics Concern  . Not on file  Social History Narrative  . Not on file    [name]'s family history includes Colon polyps in his father and mother; Heart disease in his brother, maternal aunt, maternal grandmother, maternal uncle, and mother; Pancreatic cancer in his paternal grandfather.      Objective:    Vitals:   08/23/18 0836  BP: 118/80  Pulse: 76    Physical Exam; well-developed white male in no acute distress, pleasant.  Height 5 foot 10, weight 226, BMI 32.4.  HEENT nontraumatic normocephalic EOMI PERRLA sclera anicteric.  Neuropsych, alert and oriented, grossly nonfocal mood and affect appropriate.  Not further examined today       Assessment & Plan:   #21 43 year old white male with GERD and short segment Barrett's esophagus who comes in today for routine follow-up.  He continues to do well on pantoprazole 40 mg p.o. every morning is currently asymptomatic.  Plan; continue Protonix 40 mg p.o. every morning #30 and 11 refills We have discussed proceeding with follow-up EGD now, versus waiting till later in the year.  Appendectomy was indicated for 3 to 5-year interval follow-up.  He would like to wait until December if possible when he will have vacation time.  Patient will be in system for a reminder to call and schedule for EGD with Thomas Dixon in October 2020. He will follow-up in interim as needed.  Thomas S Esterwood PA-C 08/23/2018   Cc: No ref. provider found

## 2019-06-24 ENCOUNTER — Encounter: Payer: Self-pay | Admitting: Gastroenterology

## 2019-09-21 ENCOUNTER — Telehealth: Payer: Self-pay | Admitting: Physician Assistant

## 2019-09-21 ENCOUNTER — Other Ambulatory Visit: Payer: Self-pay

## 2019-09-21 MED ORDER — PANTOPRAZOLE SODIUM 40 MG PO TBEC: DELAYED_RELEASE_TABLET | ORAL | 1 refills | Status: DC

## 2019-09-21 NOTE — Telephone Encounter (Signed)
Prescription sent to pharmacy with one refill.

## 2019-09-21 NOTE — Telephone Encounter (Signed)
Pt is scheduled for an OV and requested a refill for protonix.

## 2019-10-24 ENCOUNTER — Other Ambulatory Visit: Payer: Self-pay

## 2019-10-24 ENCOUNTER — Encounter: Payer: Self-pay | Admitting: Gastroenterology

## 2019-10-24 ENCOUNTER — Ambulatory Visit (INDEPENDENT_AMBULATORY_CARE_PROVIDER_SITE_OTHER): Payer: Managed Care, Other (non HMO) | Admitting: Gastroenterology

## 2019-10-24 VITALS — BP 110/60 | HR 100 | Temp 98.5°F | Ht 69.0 in | Wt 233.0 lb

## 2019-10-24 DIAGNOSIS — K2270 Barrett's esophagus without dysplasia: Secondary | ICD-10-CM

## 2019-10-24 DIAGNOSIS — K219 Gastro-esophageal reflux disease without esophagitis: Secondary | ICD-10-CM | POA: Diagnosis not present

## 2019-10-24 DIAGNOSIS — Z01818 Encounter for other preprocedural examination: Secondary | ICD-10-CM

## 2019-10-24 MED ORDER — PANTOPRAZOLE SODIUM 40 MG PO TBEC: DELAYED_RELEASE_TABLET | ORAL | 3 refills | Status: DC

## 2019-10-24 NOTE — Progress Notes (Signed)
Failed to enter amb ref and order for covid test at the time of scheduling pt for EGD.

## 2019-10-24 NOTE — Patient Instructions (Addendum)
If you are age 42 or older, your body mass index should be between 23-30. Your Body mass index is 34.41 kg/m. If this is out of the aforementioned range listed, please consider follow up with your Primary Care Provider.  If you are age 28 or younger, your body mass index should be between 19-25. Your Body mass index is 34.41 kg/m. If this is out of the aformentioned range listed, please consider follow up with your Primary Care Provider.   You have been scheduled for an endoscopy. Please follow written instructions given to you at your visit today. If you use inhalers (even only as needed), please bring them with you on the day of your procedure. Your physician has requested that you go to www.startemmi.com and enter the access code given to you at your visit today. This web site gives a general overview about your procedure. However, you should still follow specific instructions given to you by our office regarding your preparation for the procedure.  We have sent the following medications to your pharmacy for you to pick up at your convenience: Protonix 40 mg: Take once daily  Thank you for entrusting me with your care and for choosing [facility] [facility], Dr. Stony [place]irie Cellar

## 2019-10-24 NOTE — Progress Notes (Signed)
HPI :  44 year old male here for follow-up visit for Barrett's esophagus and reflux. He has a history of short segment Barrett's esophagus noted on EGD in December 2016.  Path showed Barrett's esophagus without dysplasia.  He has chronic reflux and has been on Protonix for a long time.  He takes this once every afternoon and generally this does a pretty good job controlling his symptoms.  He does have rare breakthrough at times but not significant.  No dysphagia.  No abdominal pain.  No changes in his bowel habits.  He generally feels well without complaints.  He does not have a primary care physician. Last labs he has in our system is in 2019 from [place] at which time he had normal liver and kidney function.  EGD 08/02/2015 - The esophagus was normal. DH and GEJ located 40cm from the incisors. The SCJ was irregular with a roughly 1cm or less segment of salmon colored mucosa extending proximally from the GEJ. No nodularity was appreciated. Biopsies were taken to rule out Barrett's. The examined stomach was normal without ulcerations or inflammatory changes. Biopsies were taken to rule out H pylori. The duodenal bulb and 2nd portion of the duodenum were normal. Retroflexed views revealed no abnormalities. The scope was then withdrawn from the patient and the procedure completed.       Past Medical History:  Diagnosis Date  . Barrett's esophagus   . Coronary artery disease    ANEUYSM IN VEIN ON HEART  . Gastritis   . GERD (gastroesophageal reflux disease)   . Hiatal hernia    NO LONGER PRESENT WITH LAST ENDOSCOPY  . HLD (hyperlipidemia)   . Hyperlipidemia   . IBS (irritable bowel syndrome)   . Kidney stones      Past Surgical History:  Procedure Laterality Date  . CARDIAC CATHETERIZATION    . CHOLECYSTECTOMY N/A 01/03/2013   Procedure: LAPAROSCOPIC CHOLECYSTECTOMY;  Surgeon: Ralene Ok, MD;  Location: [place];  Service: General;  Laterality: N/A;  . LEFT HEART CATHETERIZATION  WITH CORONARY ANGIOGRAM N/A 09/02/2011   Procedure: LEFT HEART CATHETERIZATION WITH CORONARY ANGIOGRAM;  Surgeon: Laverda Page, MD;  Location: [facility] CATH LAB;  Service: Cardiovascular;  Laterality: N/A;  . TYMPANOSTOMY TUBE PLACEMENT     Family History  Problem Relation Age of Onset  . Colon polyps Father   . Heart disease Mother   . Colon polyps Mother   . Heart disease Brother   . Heart disease Maternal Uncle        multiple  . Heart disease Maternal Aunt        multiple  . Heart disease Maternal Grandmother   . Pancreatic cancer Paternal Grandfather   . Colon cancer Neg Hx    Social History   Tobacco Use  . Smoking status: Former Smoker    Packs/day: 2.00    Years: 14.00    Pack years: 28.00    Types: Cigarettes    Quit date: 11/19/2003    Years since quitting: 15.9  . Smokeless tobacco: Current User    Types: Chew  . Tobacco comment: 7 years ago as of 2012  Substance Use Topics  . Alcohol use: Yes    Alcohol/week: 0.0 standard drinks    Comment: occas  . Drug use: No   Current Outpatient Medications  Medication Sig Dispense Refill  . aspirin EC 81 MG tablet Take 81 mg by mouth daily.    Marland Kitchen loratadine (CLARITIN) 10 MG tablet Take 10 mg by  mouth daily.    . mometasone (NASONEX) 50 MCG/ACT nasal spray Place 2 sprays into the nose daily.    . nitroGLYCERIN (NITROSTAT) 0.4 MG SL tablet Place 0.4 mg under the tongue every 5 (five) minutes as needed. For chest pain    . pantoprazole (PROTONIX) 40 MG tablet Take 1 tablet by mouth with breakfast. 30 tablet 1   No current facility-administered medications for this visit.   Allergies  Allergen Reactions  . Aciphex [Rabeprazole Sodium]   . Percocet [Oxycodone-Acetaminophen] Itching  . Zocor [Simvastatin]     REACTION: muscle aches, eye symptoms, fatigue  . Rabeprazole Sodium Rash    "aciphex" causes whelts     Review of Systems: All systems reviewed and negative except where noted in HPI.    Labs reviewed in care  everywhere from 2019.  Physical Exam: BP 110/60   Pulse 100   Temp 98.5 F (36.9 C)   Ht 5\' 9"  (1.753 m)   Wt 233 lb (105.7 kg)   BMI 34.41 kg/m  Constitutional: Pleasant,well-developed, male in no acute distress. HEENT: Normocephalic and atraumatic. Conjunctivae are normal. No scleral icterus. Neck supple.  Cardiovascular: Normal rate, regular rhythm.  Pulmonary/chest: Effort normal and breath sounds normal. No wheezing, rales or rhonchi. Abdominal: Soft, nondistended, nontender. There are no masses palpable. No hepatomegaly. Extremities: no edema Lymphadenopathy: No cervical adenopathy noted. Neurological: Alert and oriented to person place and time. Skin: Skin is warm and dry. No rashes noted. Psychiatric: Normal mood and affect. Behavior is normal.   ASSESSMENT AND PLAN: 44 year old male here for reassessment of the following:  Barrett's esophagus / GERD - discussed Barrett's esophagus with him in general, he has a very short segment and the risk of malignancy is extremely low however this warrants surveillance to keep an eye on this.  I recommending an EGD to evaluate the area given its been over 3 years since his last exam.  We discussed risk benefits of endoscopy and anesthesia and he wanted to proceed.  Otherwise discussed his chronic PPI use, risks and benefits of long-term use.  Risks include that of chronic kidney disease, increased bone fracture etc.  He understands the benefits and that it controls his symptoms and may reduce his risk of esophageal cancer and he wishes to continue at present dosing.  Will await his endoscopy with further recommendations.  Otherwise I encouraged him to get a primary care manager for his routine healthcare maintenance.  He agreed  Loraine Cellar, MD [facility]

## 2019-11-11 ENCOUNTER — Other Ambulatory Visit: Payer: Self-pay | Admitting: Gastroenterology

## 2019-11-11 ENCOUNTER — Ambulatory Visit (INDEPENDENT_AMBULATORY_CARE_PROVIDER_SITE_OTHER): Payer: Managed Care, Other (non HMO)

## 2019-11-11 DIAGNOSIS — Z1159 Encounter for screening for other viral diseases: Secondary | ICD-10-CM

## 2019-11-11 LAB — SARS CORONAVIRUS 2 (TAT 6-24 HRS): SARS Coronavirus 2: NEGATIVE

## 2019-11-15 ENCOUNTER — Ambulatory Visit (AMBULATORY_SURGERY_CENTER): Payer: Managed Care, Other (non HMO) | Admitting: Gastroenterology

## 2019-11-15 ENCOUNTER — Encounter: Payer: Managed Care, Other (non HMO) | Admitting: Gastroenterology

## 2019-11-15 ENCOUNTER — Encounter: Payer: Self-pay | Admitting: Gastroenterology

## 2019-11-15 ENCOUNTER — Other Ambulatory Visit: Payer: Self-pay

## 2019-11-15 VITALS — BP 135/90 | HR 86 | Temp 96.9°F | Resp 18 | Ht 69.0 in | Wt 233.0 lb

## 2019-11-15 DIAGNOSIS — K2270 Barrett's esophagus without dysplasia: Secondary | ICD-10-CM | POA: Diagnosis present

## 2019-11-15 DIAGNOSIS — K317 Polyp of stomach and duodenum: Secondary | ICD-10-CM | POA: Diagnosis not present

## 2019-11-15 DIAGNOSIS — K449 Diaphragmatic hernia without obstruction or gangrene: Secondary | ICD-10-CM

## 2019-11-15 MED ORDER — SODIUM CHLORIDE 0.9 % IV SOLN: 500.0000 mL | Freq: Once | INTRAVENOUS | Status: DC

## 2019-11-15 NOTE — Op Note (Signed)
[place] Patient Name: Hozie Roubal Procedure Date: 11/15/2019 1:27 PM MRN: [id number] Endoscopist: Remo Lipps P. Havery Moros , MD Age: 44 Referring MD:  Date of Birth: [birth year] Gender: Male Account #: [id number] Procedure:                Upper GI endoscopy Indications:              Follow-up of Barrett's esophagus - short segment                            noted 07/2015 Medicines:                Monitored Anesthesia Care Procedure:                Pre-Anesthesia Assessment:                           - Prior to the procedure, a History and Physical                            was performed, and patient medications and                            allergies were reviewed. The patient's tolerance of                            previous anesthesia was also reviewed. The risks                            and benefits of the procedure and the sedation                            options and risks were discussed with the patient.                            All questions were answered, and informed consent                            was obtained. Prior Anticoagulants: The patient has                            taken no previous anticoagulant or antiplatelet                            agents. ASA Grade Assessment: II - A patient with                            mild systemic disease. After reviewing the risks                            and benefits, the patient was deemed in                            satisfactory condition to undergo the procedure.  After obtaining informed consent, the endoscope was                            passed under direct vision. Throughout the                            procedure, the patient's blood pressure, pulse, and                            oxygen saturations were monitored continuously. The                            Endoscope was introduced through the mouth, and                            advanced to the second part of  duodenum. The upper                            GI endoscopy was accomplished without difficulty.                            The patient tolerated the procedure well. Scope In: Scope Out: Findings:                 Esophagogastric landmarks were identified: the                            Z-line was found at 38 cm, the gastroesophageal                            junction was found at 39 cm and the upper extent of                            the gastric folds was found at 40 cm from the                            incisors.                           A 1 cm hiatal hernia was present.                           The Z-line was slightly irregular, short tongue of                            salmon colored mucosa extending up from the GEJ.                            Seemed shorter than when previously noted in 2016 (                            now less < 1cm). Biopsies were taken with a cold  forceps for histology given findings on the last                            exam.                           The exam of the esophagus was otherwise normal.                           A few small sessile polyps were found in the                            gastric body, grossly consistent with benign fundic                            gland polyps.                           The exam of the stomach was otherwise normal.                           The duodenal bulb and second portion of the                            duodenum were normal. Complications:            No immediate complications. Estimated blood loss:                            Minimal. Estimated Blood Loss:     Estimated blood loss was minimal. Impression:               - Esophagogastric landmarks identified.                           - 1 cm hiatal hernia.                           - Z-line slightly irregular. Biopsied given prior                            history of Barrett's.                           - A few benign appearing  likely fundic gland                            gastric polyps                           - Normal stomach otherwise                           - Normal duodenal bulb and second portion of the                            duodenum. Recommendation:           -  Patient has a contact number available for                            emergencies. The signs and symptoms of potential                            delayed complications were discussed with the                            patient. Return to normal activities tomorrow.                            Written discharge instructions were provided to the                            patient.                           - Resume previous diet.                           - Continue present medications.                           - Await pathology results. Remo Lipps P. Shaunta Oncale, MD 11/15/2019 1:44:35 PM This report has been signed electronically.

## 2019-11-15 NOTE — Patient Instructions (Signed)
YOU HAD AN ENDOSCOPIC PROCEDURE TODAY AT THE [facility]:   Refer to the procedure report that was given to you for any specific questions about what was found during the examination.  If the procedure report does not answer your questions, please call your gastroenterologist to clarify.  If you requested that your care partner not be given the details of your procedure findings, then the procedure report has been included in a sealed envelope for you to review at your convenience later.  YOU SHOULD EXPECT: Some feelings of bloating in the abdomen. Passage of more gas than usual.  Walking can help get rid of the air that was put into your GI tract during the procedure and reduce the bloating. If you had a lower endoscopy (such as a colonoscopy or flexible sigmoidoscopy) you may notice spotting of blood in your stool or on the toilet paper. If you underwent a bowel prep for your procedure, you may not have a normal bowel movement for a few days.  Please Note:  You might notice some irritation and congestion in your nose or some drainage.  This is from the oxygen used during your procedure.  There is no need for concern and it should clear up in a day or so.  SYMPTOMS TO REPORT IMMEDIATELY:    Following upper endoscopy (EGD)  Vomiting of blood or coffee ground material  New chest pain or pain under the shoulder blades  Painful or persistently difficult swallowing  New shortness of breath  Fever of 100F or higher  Black, tarry-looking stools  For urgent or emergent issues, a gastroenterologist can be reached at any hour by calling [phone]. Do not use MyChart messaging for urgent concerns.    DIET:  We do recommend a small meal at first, but then you may proceed to your regular diet.  Drink plenty of fluids but you should avoid alcoholic beverages for 24 hours.  ACTIVITY:  You should plan to take it easy for the rest of today and you should NOT DRIVE or use heavy machinery  until tomorrow (because of the sedation medicines used during the test).    FOLLOW UP: Our staff will call the number listed on your records 48-72 hours following your procedure to check on you and address any questions or concerns that you may have regarding the information given to you following your procedure. If we do not reach you, we will leave a message.  We will attempt to reach you two times.  During this call, we will ask if you have developed any symptoms of COVID 19. If you develop any symptoms (ie: fever, flu-like symptoms, shortness of breath, cough etc.) before then, please call [phone].  If you test positive for Covid 19 in the 2 weeks post procedure, please call and report this information to us.    If any biopsies were taken you will be contacted by phone or by letter within the next 1-3 weeks.  Please call us at [phone] if you have not heard about the biopsies in 3 weeks.    SIGNATURES/CONFIDENTIALITY: You and/or your care partner have signed paperwork which will be entered into your electronic medical record.  These signatures attest to the fact that that the information above on your After Visit Summary has been reviewed and is understood.  Full responsibility of the confidentiality of this discharge information lies with you and/or your care-partner. 

## 2019-11-15 NOTE — Progress Notes (Signed)
pt tolerated well. VSS. awake and to recovery. Report given to RN.  

## 2019-11-15 NOTE — Progress Notes (Signed)
Called to room to assist during endoscopic procedure.  Patient ID and intended procedure confirmed with present staff. Received instructions for my participation in the procedure from the performing physician.  

## 2019-11-15 NOTE — Progress Notes (Signed)
[place]

## 2019-11-17 ENCOUNTER — Telehealth: Payer: Self-pay

## 2019-11-17 NOTE — Telephone Encounter (Signed)
NO ANSWER, MESSAGE LEFT FOR PATIENT. 

## 2019-11-17 NOTE — Telephone Encounter (Signed)
Second attempt follow up call to pt, lm on vm. 

## 2019-11-22 ENCOUNTER — Encounter: Payer: Self-pay | Admitting: Gastroenterology

## 2020-01-19 ENCOUNTER — Emergency Department
Admission: EM | Admit: 2020-01-19 | Discharge: 2020-01-19 | Disposition: A | Discharge status: Home / Self Care | Payer: Managed Care, Other (non HMO) | Attending: Emergency Medicine | Admitting: Emergency Medicine

## 2020-01-19 ENCOUNTER — Emergency Department: Payer: Managed Care, Other (non HMO)

## 2020-01-19 ENCOUNTER — Other Ambulatory Visit: Payer: Self-pay

## 2020-01-19 DIAGNOSIS — I2510 Atherosclerotic heart disease of native coronary artery without angina pectoris: Secondary | ICD-10-CM | POA: Diagnosis not present

## 2020-01-19 DIAGNOSIS — R1032 Left lower quadrant pain: Secondary | ICD-10-CM | POA: Diagnosis present

## 2020-01-19 DIAGNOSIS — N200 Calculus of kidney: Secondary | ICD-10-CM | POA: Diagnosis not present

## 2020-01-19 DIAGNOSIS — Z7982 Long term (current) use of aspirin: Secondary | ICD-10-CM | POA: Diagnosis not present

## 2020-01-19 DIAGNOSIS — Z87891 Personal history of nicotine dependence: Secondary | ICD-10-CM | POA: Diagnosis not present

## 2020-01-19 DIAGNOSIS — Z79899 Other long term (current) drug therapy: Secondary | ICD-10-CM | POA: Diagnosis not present

## 2020-01-19 LAB — BASIC METABOLIC PANEL
Anion gap: 10 (ref 5–15)
BUN: 18 mg/dL (ref 6–20)
CO2: 24 mmol/L (ref 22–32)
Calcium: 9.2 mg/dL (ref 8.9–10.3)
Chloride: 104 mmol/L (ref 98–111)
Creatinine, Ser: 1.22 mg/dL (ref 0.61–1.24)
GFR calc Af Amer: >60 mL/min (ref >60)
GFR calc non Af Amer: >60 mL/min (ref >60)
Glucose, Bld: 158 mg/dL — ABNORMAL HIGH (ref 70–99)
Potassium: 3.9 mmol/L (ref 3.5–5.1)
Sodium: 138 mmol/L (ref 135–145)

## 2020-01-19 LAB — URINALYSIS, COMPLETE (UACMP) WITH MICROSCOPIC
Bacteria, UA: NONE SEEN
Bilirubin Urine: NEGATIVE
Glucose, UA: NEGATIVE mg/dL
Ketones, ur: 20 mg/dL — AB
Leukocytes,Ua: NEGATIVE
Nitrite: NEGATIVE
Protein, ur: NEGATIVE mg/dL
Specific Gravity, Urine: 1.027 (ref 1.005–1.030)
pH: 5 (ref 5.0–8.0)

## 2020-01-19 LAB — CBC
HCT: 47 % (ref 39.0–52.0)
Hemoglobin: 15.7 g/dL (ref 13.0–17.0)
MCH: 29.4 pg (ref 26.0–34.0)
MCHC: 33.4 g/dL (ref 30.0–36.0)
MCV: 88 fL (ref 80.0–100.0)
Platelets: 260 10*3/uL (ref 150–400)
RBC: 5.34 MIL/uL (ref 4.22–5.81)
RDW: 13.2 % (ref 11.5–15.5)
WBC: 16.6 10*3/uL — ABNORMAL HIGH (ref 4.0–10.5)
nRBC: 0 % (ref 0.0–0.2)

## 2020-01-19 MED ORDER — SODIUM CHLORIDE 0.9 % IV BOLUS
1000.0000 mL | Freq: Once | INTRAVENOUS | Status: AC
  Administered 2020-01-19: 1000 mL via INTRAVENOUS

## 2020-01-19 MED ORDER — MORPHINE SULFATE (PF) 4 MG/ML IV SOLN
4.0000 mg | Freq: Once | INTRAVENOUS | Status: AC
  Administered 2020-01-19: 4 mg via INTRAVENOUS
  Filled 2020-01-19: qty 1

## 2020-01-19 MED ORDER — ONDANSETRON 8 MG PO TBDP
8.0000 mg | ORAL_TABLET | Freq: Three times a day (TID) | ORAL | 0 refills | Status: DC | PRN
Start: 2020-01-19 — End: 2021-04-01

## 2020-01-19 MED ORDER — ONDANSETRON HCL 4 MG/2ML IJ SOLN
4.0000 mg | Freq: Once | INTRAMUSCULAR | Status: AC
  Administered 2020-01-19: 4 mg via INTRAVENOUS
  Filled 2020-01-19: qty 2

## 2020-01-19 MED ORDER — IBUPROFEN 600 MG PO TABS
600.0000 mg | ORAL_TABLET | Freq: Four times a day (QID) | ORAL | 0 refills | Status: DC | PRN
Start: 2020-01-19 — End: 2023-06-09

## 2020-01-19 MED ORDER — FENTANYL CITRATE (PF) 100 MCG/2ML IJ SOLN
50.0000 ug | INTRAMUSCULAR | Status: DC | PRN
  Administered 2020-01-19: 50 ug via INTRAVENOUS
  Filled 2020-01-19: qty 2

## 2020-01-19 MED ORDER — HYDROCODONE-ACETAMINOPHEN 5-325 MG PO TABS: 1.0000 | ORAL_TABLET | ORAL | 0 refills | Status: AC | PRN

## 2020-01-19 MED ORDER — ONDANSETRON HCL 4 MG/2ML IJ SOLN
4.0000 mg | Freq: Once | INTRAMUSCULAR | Status: DC | PRN
  Filled 2020-01-19: qty 2

## 2020-01-19 MED ORDER — KETOROLAC TROMETHAMINE 30 MG/ML IJ SOLN
30.0000 mg | Freq: Once | INTRAMUSCULAR | Status: AC
  Administered 2020-01-19: 30 mg via INTRAVENOUS
  Filled 2020-01-19: qty 1

## 2020-01-19 NOTE — ED Triage Notes (Signed)
Pt comes EMS with hx of kidney stones and reports pain of the same. Left sided flank pain. States it's the worst he's ever felt.

## 2020-01-19 NOTE — ED Notes (Addendum)
PT with L sided flank pain. HX of kidney stones. Wife reports he has a stone that has been moving around for a bout 2 years that he has not yet passed. PT visibly uncomfortable and diaphoretic.

## 2020-01-19 NOTE — ED Provider Notes (Signed)
[facility] Emergency Department Provider Note ____________________________________________   First MD Initiated Contact with Patient 01/19/20 1752     (approximate)  I have reviewed the triage vital signs and the nursing notes.   HISTORY  Chief Complaint Flank Pain    HPI Thomas Dixon is a 44 y.o. male with PMH as noted below including a prior history of kidney stones who presents with left lower quadrant pain, acute onset earlier this morning, persistent course since then, and associated with nausea and one episode of vomiting.  The patient denies any acute urinary symptoms.  He has no fever chills or change in his bowel movements.  He states that the pain feels similar to prior kidney stones, but this is the most severe pain he has felt.  Past Medical History:  Diagnosis Date  . Allergy   . Barrett's esophagus   . Coronary artery disease    ANEUYSM IN VEIN ON HEART  . Gastritis   . GERD (gastroesophageal reflux disease)   . Hiatal hernia    NO LONGER PRESENT WITH LAST ENDOSCOPY  . HLD (hyperlipidemia)   . Hyperlipidemia   . IBS (irritable bowel syndrome)   . Kidney stones     Patient Active Problem List   Diagnosis Date Noted  . 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 Hyperlipidemia 09/01/2011  . ARTHRALGIA 08/01/2010  . POLYARTHRITIS 07/10/2010  . FATIGUE 07/10/2010  . MUSCLE FASCICULATIONS 07/10/2010  . ATTENTION OR CONCENTRATION DEFICIT 07/10/2010  . RHINITIS 01/22/2010  . HYPERLIPIDEMIA 01/01/2010  . DIZZINESS 01/01/2010  . JAW PAIN 01/01/2010  . CHEST PAIN 01/01/2010  . MYALGIA 06/07/2008  . GERD 11/17/2007  . IBS 11/17/2007  . BARRETTS ESOPHAGUS 05/15/2006    Past Surgical History:  Procedure Laterality Date  . CARDIAC CATHETERIZATION    . CHOLECYSTECTOMY N/A 01/03/2013   Procedure: LAPAROSCOPIC CHOLECYSTECTOMY;  Surgeon: Ralene Ok, MD;  Location: [facility];  Service: General;  Laterality: N/A;  . LEFT HEART  CATHETERIZATION WITH CORONARY ANGIOGRAM N/A 09/02/2011   Procedure: LEFT HEART CATHETERIZATION WITH CORONARY ANGIOGRAM;  Surgeon: Laverda Page, MD;  Location: [facility] CATH LAB;  Service: Cardiovascular;  Laterality: N/A;  . TYMPANOSTOMY TUBE PLACEMENT    . UPPER GASTROINTESTINAL ENDOSCOPY      Prior to Admission medications   Medication Sig Start Date End Date Taking? Authorizing Provider  aspirin EC 81 MG tablet Take 81 mg by mouth daily.    [provider]  HYDROcodone-acetaminophen (NORCO/VICODIN) 5-325 MG tablet Take 1 tablet by mouth every 4 (four) hours as needed for up to 5 days for severe pain. 01/19/20 01/24/20  Arta Silence, MD  ibuprofen (ADVIL) 600 MG tablet Take 1 tablet (600 mg total) by mouth every 6 (six) hours as needed. 01/19/20   Arta Silence, MD  loratadine (CLARITIN) 10 MG tablet Take 10 mg by mouth daily.    [provider]  mometasone (NASONEX) 50 MCG/ACT nasal spray Place 2 sprays into the nose daily.    [provider]  nitroGLYCERIN (NITROSTAT) 0.4 MG SL tablet Place 0.4 mg under the tongue every 5 (five) minutes as needed. For chest pain    [provider]  ondansetron (ZOFRAN ODT) 8 MG disintegrating tablet Take 1 tablet (8 mg total) by mouth every 8 (eight) hours as needed for nausea or vomiting. 01/19/20   Arta Silence, MD  pantoprazole (PROTONIX) 40 MG tablet Take 1 tablet by mouth with breakfast. 10/24/19   Armbruster, Carlota Raspberry, MD  Allergies Aciphex [rabeprazole sodium], Percocet [oxycodone-acetaminophen], Zocor [simvastatin], and Rabeprazole sodium  Family History  Problem Relation Age of Onset  . Colon polyps Father   . Barrett's esophagus Father   . Heart disease Mother   . Colon polyps Mother   . Heart disease Brother   . Heart disease Maternal Uncle        multiple  . Heart disease Maternal Aunt        multiple  . Heart disease Maternal Grandmother   . Pancreatic cancer Paternal Grandfather    . Colon cancer Neg Hx   . Esophageal cancer Neg Hx   . Prostate cancer Neg Hx   . Rectal cancer Neg Hx   . Stomach cancer Neg Hx     Social History Social History   Tobacco Use  . Smoking status: Former Smoker    Packs/day: 2.00    Years: 14.00    Pack years: 28.00    Types: Cigarettes    Quit date: 11/19/2003    Years since quitting: 16.1  . Smokeless tobacco: Current User    Types: Chew  . Tobacco comment: 7 years ago as of 2012  Vaping Use  . Vaping Use: Never used  Substance Use Topics  . Alcohol use: Yes    Alcohol/week: 0.0 standard drinks    Comment: occas  . Drug use: No    Review of Systems  Constitutional: No fever/chills. Eyes: No redness. ENT: No sore throat. Cardiovascular: Denies chest pain. Respiratory: Denies shortness of breath. Gastrointestinal: Positive for nausea. Genitourinary: Negative for dysuria or hematuria.  Musculoskeletal: Negative for back pain. Skin: Negative for rash. Neurological: Negative for headache.   ____________________________________________   PHYSICAL EXAM:  VITAL SIGNS: ED Triage Vitals  Enc Vitals Group     BP 01/19/20 1435 (!) 165/111     Pulse Rate 01/19/20 1435 89     Resp 01/19/20 1435 18     Temp 01/19/20 1435 98.1 F (36.7 C)     Temp Source 01/19/20 1435 Oral     SpO2 01/19/20 1435 97 %     Weight 01/19/20 1436 210 lb (95.3 kg)     Height 01/19/20 1436 5\' 10"  (1.778 m)     Head Circumference --      Peak Flow --      Pain Score 01/19/20 1435 10     Pain Loc --      Pain Edu? --      Excl. in [place]? --     Constitutional: Alert and oriented.  Uncomfortable appearing but in no acute distress. Eyes: Conjunctivae are normal.  No scleral icterus. Head: Atraumatic. Nose: No congestion/rhinnorhea. Mouth/Throat: Mucous membranes are moist.   Neck: Normal range of motion.  Cardiovascular: Normal rate, regular rhythm.  Good peripheral circulation. Respiratory: Normal respiratory effort.  No  retractions. Gastrointestinal: Soft and nontender. No distention.  Genitourinary: No CVA tenderness. Musculoskeletal: Extremities warm and well perfused.  Neurologic:  Normal speech and language. No gross focal neurologic deficits are appreciated.  Skin:  Skin is warm and dry. No rash noted. Psychiatric: Mood and affect are normal. Speech and behavior are normal.  ____________________________________________   LABS (all labs ordered are listed, but only abnormal results are displayed)  Labs Reviewed  URINALYSIS, COMPLETE (UACMP) WITH MICROSCOPIC - Abnormal; Notable for the following components:      Result Value   Color, Urine YELLOW (*)    APPearance HAZY (*)    Hgb urine dipstick MODERATE (*)  Ketones, ur 20 (*)    All other components within normal limits  BASIC METABOLIC PANEL - Abnormal; Notable for the following components:   Glucose, Bld 158 (*)    All other components within normal limits  CBC - Abnormal; Notable for the following components:   WBC 16.6 (*)    All other components within normal limits   ____________________________________________  EKG  ____________________________________________  RADIOLOGY  CT abdomen: 4 mm stone in the distal left ureter with mild left hydronephrosis.  Small stone just upstream of this.  ____________________________________________   PROCEDURES  Procedure(s) performed: No  Procedures  Critical Care performed: *No ____________________________________________   INITIAL IMPRESSION / ASSESSMENT AND PLAN / ED COURSE  Pertinent labs & imaging results that were available during my care of the patient were reviewed by me and considered in my medical decision making (see chart for details).  44 year old male with PMH as noted above presents with acute onset of left lower quadrant and left flank pain this morning somewhat similar to prior kidney stone pain, but which he states is more severe than he has ever had before.  He  reports nausea and one episode of vomiting.  I reviewed the past medical records in [place].  The patient was most recently seen here and imaged in 2016 with a 4 mm stone at that time.  He states that his other stones have usually passed on their own.  On exam, the patient is uncomfortable but not acutely ill-appearing.  He is hypertensive, likely due to acute pain, with otherwise normal vital signs.  He has no significant tenderness in the abdomen or flank.  Overall presentation is most consistent with ureteral stone.  Given the duration of the symptoms and the fact that the pain seems more severe than he has experienced previously, we will obtain a CT for further evaluation to rule out a large stone that may not pass on its own.  Patient received fentanyl in triage with mild relief, and I will give morphine or Toradol at this time.  ----------------------------------------- 9:02 PM on 01/19/2020 -----------------------------------------  The patient had good relief with the analgesia.  He is having some pain returning so I will give a second dose, however he states he generally feels much better. He wanted a second liter of NS which I felt was reasonable.    CT shows a 60mm stone in the distal right ureter.  This should pass on its own.  The patient states he feels comfortable and wants to go home when the 2nd liter is done.  I will give him urology referral given his extensive history of stones; he states he would like a new urologist in this area.  I discussed the CT results and plan of care with him, and prescribed vicodin and ibuprofen.  I gave him thorough return precautions and he expressed understanding.   ____________________________________________   FINAL CLINICAL IMPRESSION(S) / ED DIAGNOSES  Final diagnoses:  Kidney stone      NEW MEDICATIONS STARTED DURING THIS VISIT:  New Prescriptions   HYDROCODONE-ACETAMINOPHEN (NORCO/VICODIN) 5-325 MG TABLET    Take 1 tablet by mouth  every 4 (four) hours as needed for up to 5 days for severe pain.   IBUPROFEN (ADVIL) 600 MG TABLET    Take 1 tablet (600 mg total) by mouth every 6 (six) hours as needed.   ONDANSETRON (ZOFRAN ODT) 8 MG DISINTEGRATING TABLET    Take 1 tablet (8 mg total) by mouth every 8 (eight) hours  as needed for nausea or vomiting.     Note:  This document was prepared using Dragon voice recognition software and may include unintentional dictation errors.    Arta Silence, MD 01/19/20 2104

## 2020-01-19 NOTE — Discharge Instructions (Addendum)
Return to the ER for new, worsening or persistent severe pain, vomiting, weakness or any other new or worsening symptoms that concern you.  Follow up with a urologist within the next 1-2 weeks.

## 2020-11-18 ENCOUNTER — Other Ambulatory Visit: Payer: Self-pay | Admitting: Gastroenterology

## 2021-02-06 ENCOUNTER — Telehealth: Payer: Self-pay | Admitting: Gastroenterology

## 2021-02-06 NOTE — Telephone Encounter (Signed)
If the patient is currently having a hard time passing a kidney stone, he will need to see a Urologist for assistance with this. I don't know who he saw in the past, but [facility] is the only group in [place]. He can contact them to get another opinion from another provider there or we can refer him to another Urology group but that would probably be [facility], [place], [place], etc.   If he has continued recurrent renal stones over time then agree he should also see a Nephrologist, but they would focus on prevention of recurrent stones, not acute management of helping to pass a current one. I am happy to refer him to [facility] but I do think he also needs to see a Urologist for acute assistance with helping to pass the stone. Please make sure they know the difference between these two referrals.   Up to him if they wish to see [facility] again or go to a new group, but could have a wait if he goes to another center. Unfortunately I don't treat kidney stones and he will need to see specialists for this. Can you let me know what they prefer? Thanks

## 2021-02-06 NOTE — Telephone Encounter (Signed)
Inbound call from patient's mother requesting a call from a nurse please.  Has questions about [facility] possibly doing a referral for patient.

## 2021-02-06 NOTE — Telephone Encounter (Signed)
Spoke with patient's mother in regards to concerns. Patient's mother states that patient has dealt with kidney stones for 4 years and has a history with [facility]. Mother states that the last time patient went to [facility] he was seen by a PA that was very rude and patient decided that he no longer wanted to continue care there. Patient's mother states that it was recommended that patient see a specialist at [place]. Patient's mother states that patient is in the middle of trying to pass a stone now, she states that he passes one every other week. Patient's mother is requesting a referral to [facility] for evaluation, she states that they have all of his records from [facility]. She states that we can also schedule patient a follow up appt here if needed, next available appt is not until the end of August. Please advise, thanks.

## 2021-02-07 NOTE — Telephone Encounter (Signed)
Spoke with patient's mother in regards to recommendations. Patient's mother states that they are currently working on a referral to Urologist Dr. Butch Penny who has just left from [facility]. She states as soon as they figure out where he is going they will try to get patient scheduled. Patient's mother states that patient has been dealing with this for years and every time he sees a urologist they send him home and tell him to pass it. She is aware that [facility] will be able to assist in prevention of recurrent stones. Patient's mother is aware that [facility] will contact her directly to set up patient's appt. Patient's mother wanted to schedule patient his yearly follow up with Dr. Havery Moros. Patient has been scheduled for a follow up on Monday, 04/01/21 at 8:10 am. Patient's mother verbalized understanding of all information and had no concerns at the end of the call.  Referral, records, demographic and insurance information faxed to [facility].

## 2021-02-14 NOTE — Telephone Encounter (Signed)
Noted  

## 2021-02-14 NOTE — Telephone Encounter (Signed)
Rec'd fax from [facility]. Patient has appointment with Dr. Gean Quint on 02-22-21 at 8:00am.

## 2021-04-01 ENCOUNTER — Ambulatory Visit (INDEPENDENT_AMBULATORY_CARE_PROVIDER_SITE_OTHER): Payer: Managed Care, Other (non HMO) | Admitting: Gastroenterology

## 2021-04-01 ENCOUNTER — Encounter: Payer: Self-pay | Admitting: Gastroenterology

## 2021-04-01 VITALS — BP 124/84 | HR 98 | Ht 69.0 in | Wt 214.0 lb

## 2021-04-01 DIAGNOSIS — K2270 Barrett's esophagus without dysplasia: Secondary | ICD-10-CM

## 2021-04-01 DIAGNOSIS — Z79899 Other long term (current) drug therapy: Secondary | ICD-10-CM | POA: Diagnosis not present

## 2021-04-01 DIAGNOSIS — K219 Gastro-esophageal reflux disease without esophagitis: Secondary | ICD-10-CM | POA: Diagnosis not present

## 2021-04-01 DIAGNOSIS — N200 Calculus of kidney: Secondary | ICD-10-CM

## 2021-04-01 MED ORDER — PANTOPRAZOLE SODIUM 40 MG PO TBEC: 40.0000 mg | DELAYED_RELEASE_TABLET | Freq: Every day | ORAL | 3 refills | Status: DC

## 2021-04-01 NOTE — Patient Instructions (Signed)
If you are age 43 or older, your body mass index should be between 23-30. Your Body mass index is 31.6 kg/m. If this is out of the aforementioned range listed, please consider follow up with your Primary Care Provider.  If you are age 10 or younger, your body mass index should be between 19-25. Your Body mass index is 31.6 kg/m. If this is out of the aformentioned range listed, please consider follow up with your Primary Care Provider.   __________________________________________________________  The [facility] providers would like to encourage you to use [facility] to communicate with providers for non-urgent requests or questions.  Due to long hold times on the telephone, sending your provider a message by [facility] may be a faster and more efficient way to get a response.  Please allow 48 business hours for a response.  Please remember that this is for non-urgent requests.   We have sent the following medications to your pharmacy for you to pick up at your convenience: Protonix   Please follow up in one year.  Thank you for entrusting me with your care and for choosing [facility], Dr. [place] Cellar

## 2021-04-01 NOTE — Progress Notes (Signed)
HPI :  45 year old male with a history of Barrett's and reflux, renal stones, here for a follow-up visit.  Last time I saw him was in April 2021, he had an EGD at that time which showed a very short segment of Barrett's esophagus.  We discussed continuing PPI, he is taking 40 mg of Protonix per day and that controls his symptoms pretty well.  If he misses a dose he will have some breakthrough of heartburn and reflux that bothers him.  If he takes it every day he does not have any concerning symptoms.  No dysphagia.  No nausea or vomiting, otherwise eating well.  His main issues he has been dealing with lately has been recurrent/persistent renal stones.  This has been an ongoing issue for him over the years.  He had a severe attack of this last month and has been frustrated by his care with urology locally in [place] so sought another opinion by Dr. Zigmund Daniel in [place].  He states he had a cystoscopy and had 7 renal stones removed over the past month and had 2 stents placed.  He is doing better in this regard over time and is much more comfortable.  He is completing a 24-hour urine for his renal stone evaluation and follow-up with urology.  He asked me for referral to nephrology.  Which I placed for him to discuss preventative measures to reduce his stone recurrence.  He has been dealing with renal stones for over 20 years.  Has never had a prior colonoscopy.  No bleeding symptoms.  Occasional loose stools but otherwise no major bowel problems.  Prior workup:  EGD 08/02/2015 - The esophagus was normal. DH and GEJ located 40cm from the incisors. The SCJ was irregular with a roughly 1cm or less segment of salmon colored mucosa extending proximally from the GEJ. No nodularity was appreciated. Biopsies were taken to rule out Barrett's. The examined stomach was normal without ulcerations or inflammatory changes. Biopsies were taken to rule out H pylori. The duodenal bulb and 2nd portion of the duodenum  were normal. Retroflexed views revealed no abnormalities. The scope was then withdrawn from the patient and the procedure completed.   EGD 11/15/19 -  - A 1 cm hiatal hernia was present. - The Z-line was slightly irregular, short tongue of salmon colored mucosa extending up from the GEJ. Seemed shorter than when previously noted in 2016 ( now less < 1cm). Biopsies were taken with a cold forceps for histology given findings on the last exam. - The exam of the esophagus was otherwise normal. - A few small sessile polyps were found in the gastric body, grossly consistent with benign fundic gland polyps. - The exam of the stomach was otherwise normal. - The duodenal bulb and second portion of the duodenum were normal.  Surgical [P], distal esophagus - BARRETT ESOPHAGUS - NO DYSPLASIA OR MALIGNANCY IDENTIFIED - SEE COMMENT   Past Medical History:  Diagnosis Date   Allergy    Barrett's esophagus    Coronary artery disease    ANEUYSM IN VEIN ON HEART   Gastritis    GERD (gastroesophageal reflux disease)    Hiatal hernia    NO LONGER PRESENT WITH LAST ENDOSCOPY   HLD (hyperlipidemia)    Hyperlipidemia    IBS (irritable bowel syndrome)    Kidney stones      Past Surgical History:  Procedure Laterality Date   CARDIAC CATHETERIZATION     CHOLECYSTECTOMY N/A 01/03/2013   Procedure: LAPAROSCOPIC  CHOLECYSTECTOMY;  Surgeon: Ralene Ok, MD;  Location: [facility];  Service: General;  Laterality: N/A;   LEFT HEART CATHETERIZATION WITH CORONARY ANGIOGRAM N/A 09/02/2011   Procedure: LEFT HEART CATHETERIZATION WITH CORONARY ANGIOGRAM;  Surgeon: Laverda Page, MD;  Location: [facility] CATH LAB;  Service: Cardiovascular;  Laterality: N/A;   TYMPANOSTOMY TUBE PLACEMENT     UPPER GASTROINTESTINAL ENDOSCOPY     Family History  Problem Relation Age of Onset   Colon polyps Father    Barrett's esophagus Father    Heart disease Mother    Colon polyps Mother    Heart disease Brother    Heart disease  Maternal Uncle        multiple   Heart disease Maternal Aunt        multiple   Heart disease Maternal Grandmother    Pancreatic cancer Paternal Grandfather    Colon cancer Neg Hx    Esophageal cancer Neg Hx    Prostate cancer Neg Hx    Rectal cancer Neg Hx    Stomach cancer Neg Hx    Social History   Tobacco Use   Smoking status: Former    Packs/day: 2.00    Years: 14.00    Pack years: 28.00    Types: Cigarettes    Quit date: 11/19/2003    Years since quitting: 17.3   Smokeless tobacco: Current    Types: Chew   Tobacco comments:    7 years ago as of 2012  Vaping Use   Vaping Use: Never used  Substance Use Topics   Alcohol use: Yes    Alcohol/week: 0.0 standard drinks    Comment: occas   Drug use: No   Current Outpatient Medications  Medication Sig Dispense Refill   aspirin EC 81 MG tablet Take 81 mg by mouth daily.     cromolyn (NASALCROM) 5.2 MG/ACT nasal spray Place 1 spray into both nostrils daily.     ibuprofen (ADVIL) 600 MG tablet Take 1 tablet (600 mg total) by mouth every 6 (six) hours as needed. 30 tablet 0   mometasone (NASONEX) 50 MCG/ACT nasal spray Place 2 sprays into the nose daily.     pantoprazole (PROTONIX) 40 MG tablet TAKE 1 TABLET BY MOUTH WITH BREAKFAST 90 tablet 1   nitroGLYCERIN (NITROSTAT) 0.4 MG SL tablet Place 0.4 mg under the tongue every 5 (five) minutes as needed. For chest pain (Patient not taking: Reported on 04/01/2021)     No current facility-administered medications for this visit.   Allergies  Allergen Reactions   Aciphex [Rabeprazole Sodium]    Percocet [Oxycodone-Acetaminophen] Itching   Zocor [Simvastatin]     REACTION: muscle aches, eye symptoms, fatigue   Rabeprazole Sodium Rash    "aciphex" causes whelts     Review of Systems: All systems reviewed and negative except where noted in HPI.    Lab Results  Component Value Date   WBC 16.6 (H) 01/19/2020   HGB 15.7 01/19/2020   HCT 47.0 01/19/2020   MCV 88.0 01/19/2020    PLT 260 01/19/2020    Lab Results  Component Value Date   CREATININE 1.22 01/19/2020   BUN 18 01/19/2020   NA 138 01/19/2020   K 3.9 01/19/2020   CL 104 01/19/2020   CO2 24 01/19/2020    Lab Results  Component Value Date   ALT 16 05/07/2015   AST 18 05/07/2015   ALKPHOS 59 05/07/2015   ALKPHOS 59 05/07/2015   BILITOT 0.4 05/07/2015   More  recent labs reviewed in care everywhere  Physical Exam: BP 124/84   Pulse 98   Ht '5\' 9"'$  (1.753 m)   Wt 214 lb (97.1 kg)   SpO2 100%   BMI 31.60 kg/m  Constitutional: Pleasant,well-developed, male in no acute distress. Neurological: Alert and oriented to person place and time. Psychiatric: Normal mood and affect. Behavior is normal.   ASSESSMENT AND PLAN: 45 year old male here for reassessment of following:  Barrett's esophagus GERD Long-term PPI use Renal stones  Reviewed the patient's history.  He has a short segment of Barrett's esophagus and hopefully his risk for esophageal cancer over time is quite low.  I have recommended indefinite PPI use for now to help reduce his risk for esophageal cancer.  He is dosed at 40 mg of Protonix once daily, we will continue that for now as he clearly has breakthrough symptoms if he misses even the dose.  We discussed long-term risks/benefits of chronic PPI use.  His renal function is normal.  He understands the risks but I do think benefits outweigh risks in regards to his history of Barrett's and recommend he continue it for now.  He has had quite a bit of worked on his renal stones in recent weeks and will continue to follow-up with urology for that issue and potentially nephrology if needed in regards to her regimen to prevent recurrent stone formation.  Recommending an EGD 3 years from his last exam to reassess his Barrett's esophagus and screening colonoscopy at age 47.  He will contact me in the interim with any questions or concerns.  He agrees  Jolly Mango, MD [facility]  Gastroenterology

## 2022-01-24 ENCOUNTER — Ambulatory Visit
Admission: RE | Admit: 2022-01-24 | Discharge: 2022-01-24 | Disposition: A | Discharge status: Home / Self Care | Payer: Managed Care, Other (non HMO) | Source: Ambulatory Visit | Attending: Physician Assistant | Admitting: Physician Assistant

## 2022-01-24 ENCOUNTER — Other Ambulatory Visit: Payer: Self-pay | Admitting: Physician Assistant

## 2022-01-24 DIAGNOSIS — N200 Calculus of kidney: Secondary | ICD-10-CM

## 2022-01-24 DIAGNOSIS — R109 Unspecified abdominal pain: Secondary | ICD-10-CM

## 2022-02-24 ENCOUNTER — Encounter: Payer: Self-pay | Admitting: Gastroenterology

## 2022-04-23 ENCOUNTER — Telehealth: Payer: Self-pay | Admitting: Gastroenterology

## 2022-04-23 MED ORDER — PANTOPRAZOLE SODIUM 40 MG PO TBEC: 40.0000 mg | DELAYED_RELEASE_TABLET | Freq: Every day | ORAL | 1 refills | Status: DC

## 2022-04-23 NOTE — Telephone Encounter (Signed)
Refill sent to get to Nov appointment

## 2022-04-23 NOTE — Telephone Encounter (Signed)
Inbound call from patient requesting refill for Pantoprazole. Patient is scheduled for next available OV . Patient would like medication refilled up until next available apt. Please advise.  Verified pharmacy is on [address] in [place].  Thank you

## 2022-06-13 ENCOUNTER — Other Ambulatory Visit: Payer: Self-pay | Admitting: Gastroenterology

## 2022-06-30 ENCOUNTER — Encounter: Payer: Self-pay | Admitting: Gastroenterology

## 2022-06-30 ENCOUNTER — Ambulatory Visit (INDEPENDENT_AMBULATORY_CARE_PROVIDER_SITE_OTHER): Payer: Managed Care, Other (non HMO) | Admitting: Gastroenterology

## 2022-06-30 VITALS — BP 119/86 | HR 113 | Ht 69.0 in | Wt 224.0 lb

## 2022-06-30 DIAGNOSIS — K2270 Barrett's esophagus without dysplasia: Secondary | ICD-10-CM | POA: Diagnosis not present

## 2022-06-30 DIAGNOSIS — Z79899 Other long term (current) drug therapy: Secondary | ICD-10-CM | POA: Diagnosis not present

## 2022-06-30 DIAGNOSIS — K219 Gastro-esophageal reflux disease without esophagitis: Secondary | ICD-10-CM

## 2022-06-30 MED ORDER — PANTOPRAZOLE SODIUM 40 MG PO TBEC: 40.0000 mg | DELAYED_RELEASE_TABLET | Freq: Every day | ORAL | 3 refills | Status: DC

## 2022-06-30 NOTE — Progress Notes (Signed)
HPI :  46 year old male with a history of GERD and Barrett's, renal stones, here for follow-up visit.  He was last seen in August 2022.  Recall he had an EGD in April 2021 showing a short segment of Barrett's esophagus.  He has a history of significant reflux that bother him in the past.  He has been on multiple PPIs in the past and states Protonix is worked better than anything he has been on otherwise.  He has been on 40 mg per day of Protonix which has been working pretty well for him.  This typically controls his symptoms without any significant breakthrough.  He does have some breakthrough when he drinks coffee or drinking alcohol.  He does not drink either these things routinely but he does notice worsening of his pyrosis if he has it.  He ran out of his Protonix in recent week or so. He denies any dysphagia.  Eating well.  No nausea or vomiting.  He has stopped chewing tobacco since of last seen him.  He has not tolerated the lower dosing of Protonix as well in the past.  He has been dealing with issues regarding renal stones.  He has transitioned his care to a urologist in [place], was started on potassiums citrate as a preventative strategy to help prevent recurrent stones.  He appears to be tolerating it well.    Prior workup:  EGD 08/02/2015 - The esophagus was normal. DH and GEJ located 40cm from the incisors. The SCJ was irregular with a roughly 1cm or less segment of salmon colored mucosa extending proximally from the GEJ. No nodularity was appreciated. Biopsies were taken to rule out Barrett's. The examined stomach was normal without ulcerations or inflammatory changes. Biopsies were taken to rule out H pylori. The duodenal bulb and 2nd portion of the duodenum were normal. Retroflexed views revealed no abnormalities. The scope was then withdrawn from the patient and the procedure completed.   EGD 11/15/19 -  - A 1 cm hiatal hernia was present. - The Z-line was slightly  irregular, short tongue of salmon colored mucosa extending up from the GEJ. Seemed shorter than when previously noted in 2016 ( now less < 1cm). Biopsies were taken with a cold forceps for histology given findings on the last exam. - The exam of the esophagus was otherwise normal. - A few small sessile polyps were found in the gastric body, grossly consistent with benign fundic gland polyps. - The exam of the stomach was otherwise normal. - The duodenal bulb and second portion of the duodenum were normal.   Surgical [P], distal esophagus - BARRETT ESOPHAGUS - NO DYSPLASIA OR MALIGNANCY IDENTIFIED - SEE COMMENT      CT scan abdomen / pelvis without contrast 01/24/22: IMPRESSION: There is 6 mm calculus in the proximal right ureter close to the ureteropelvic junction causing mild right hydronephrosis. Multiple bilateral renal stones. There is no evidence intestinal obstruction or pneumoperitoneum. Other findings as described in the body of the report.  Renal function normal - last BMET June 2023   Past Medical History:  Diagnosis Date   Allergy    Barrett's esophagus    Coronary artery disease    ANEUYSM IN VEIN ON HEART   Gastritis    GERD (gastroesophageal reflux disease)    Hiatal hernia    NO LONGER PRESENT WITH LAST ENDOSCOPY   HLD (hyperlipidemia)    Hyperlipidemia    IBS (irritable bowel syndrome)    Kidney stones  Past Surgical History:  Procedure Laterality Date   CARDIAC CATHETERIZATION     CHOLECYSTECTOMY N/A 01/03/2013   Procedure: LAPAROSCOPIC CHOLECYSTECTOMY;  Surgeon: Ralene Ok, MD;  Location: [facility];  Service: General;  Laterality: N/A;   LEFT HEART CATHETERIZATION WITH CORONARY ANGIOGRAM N/A 09/02/2011   Procedure: LEFT HEART CATHETERIZATION WITH CORONARY ANGIOGRAM;  Surgeon: Laverda Page, MD;  Location: [facility] CATH LAB;  Service: Cardiovascular;  Laterality: N/A;   TYMPANOSTOMY TUBE PLACEMENT     UPPER GASTROINTESTINAL ENDOSCOPY     Family  History  Problem Relation Age of Onset   Colon polyps Father    Barrett's esophagus Father    Heart disease Mother    Colon polyps Mother    Heart disease Brother    Heart disease Maternal Uncle        multiple   Heart disease Maternal Aunt        multiple   Heart disease Maternal Grandmother    Pancreatic cancer Paternal Grandfather    Colon cancer Neg Hx    Esophageal cancer Neg Hx    Prostate cancer Neg Hx    Rectal cancer Neg Hx    Stomach cancer Neg Hx    Social History   Tobacco Use   Smoking status: Former    Packs/day: 2.00    Years: 14.00    Total pack years: 28.00    Types: Cigarettes    Quit date: 11/19/2003    Years since quitting: 18.6   Smokeless tobacco: Current    Types: Chew   Tobacco comments:    7 years ago as of 2012  Vaping Use   Vaping Use: Never used  Substance Use Topics   Alcohol use: Yes    Alcohol/week: 0.0 standard drinks of alcohol    Comment: occas   Drug use: No   Current Outpatient Medications  Medication Sig Dispense Refill   aspirin EC 81 MG tablet Take 81 mg by mouth daily.     cromolyn (NASALCROM) 5.2 MG/ACT nasal spray Place 1 spray into both nostrils daily.     ibuprofen (ADVIL) 600 MG tablet Take 1 tablet (600 mg total) by mouth every 6 (six) hours as needed. 30 tablet 0   mometasone (NASONEX) 50 MCG/ACT nasal spray Place 2 sprays into the nose daily.     nitroGLYCERIN (NITROSTAT) 0.4 MG SL tablet Place 0.4 mg under the tongue every 5 (five) minutes as needed. For chest pain (Patient not taking: Reported on 04/01/2021)     pantoprazole (PROTONIX) 40 MG tablet Take 1 tablet (40 mg total) by mouth daily. 90 tablet 3   No current facility-administered medications for this visit.   Allergies  Allergen Reactions   Aciphex [Rabeprazole Sodium]    Percocet [Oxycodone-Acetaminophen] Itching   Zocor [Simvastatin]     REACTION: muscle aches, eye symptoms, fatigue   Rabeprazole Sodium Rash    "aciphex" causes whelts     Review  of Systems: All systems reviewed and negative except where noted in HPI.   Labs reviewed in care everywhere.  Physical Exam: BP 119/86   Pulse (!) 113   Ht '5\' 9"'$  (1.753 m)   Wt 224 lb (101.6 kg)   BMI 33.08 kg/m  Constitutional: Pleasant,well-developed, male in no acute distress. Neurological: Alert and oriented to person place and time. Psychiatric: Normal mood and affect. Behavior is normal.   ASSESSMENT: 46 y.o. male here for assessment of the following  1. Barrett's esophagus without dysplasia   2. Gastroesophageal reflux  disease, unspecified whether esophagitis present   3. Long-term current use of proton pump inhibitor therapy    Short segment of Barrett's esophagus, last EGD 2021 as outlined above.  On Protonix 40 mg daily which works to control symptoms, has done so fairly well without any significant breakthrough, this regimen has worked better than others has been on in the past and wishes to continue.  I discussed long-term risks of chronic PPI with him to include risk for chronic kidney disease, increased risk of bone fracture etc.  Renal function is normal.  He wishes to continue present dosing of Protonix.  I reviewed surveillance guidelines for EGD with him, recommend repeat EGD in 11/2024 given short segment of BE.  He agrees with this.  He has stopped using chewing tobacco which is a good thing long-term for his health as well.  He is due for screening colonoscopy at age 59, December 2025.  He wishes to do colonoscopy at same time as his upper endoscopy, given they will both be due within a few months of each other.  He should see me at least once yearly if not sooner with any issues.  Refilled Protonix today for him.  PLAN: - continue protonix '40mg'$  / day - #90 RF3. Discussed long term risks / benefits of chronic PPI use, working well for him - recall EGD 11/2024 - colonoscopy for screening age 69, 07/2024  Jolly Mango, MD [facility] Gastroenterology

## 2022-06-30 NOTE — Patient Instructions (Addendum)
If you are age 44 or older, your body mass index should be between 23-30. Your Body mass index is 33.08 kg/m. If this is out of the aforementioned range listed, please consider follow up with your Primary Care Provider.  If you are age 67 or younger, your body mass index should be between 19-25. Your Body mass index is 33.08 kg/m. If this is out of the aformentioned range listed, please consider follow up with your Primary Care Provider.   ________________________________________________________  Continue Protonix 40 mg once daily.  1 year refills sent to pharmacy.  You will be due for a recall colonoscopy in 07-2024. We will send you a reminder in the mail when it gets closer to that time.   You will be due for a recall EGD Endoscopy in 11-2024. We will send you a reminder in the mail when it gets closer to that time.  Thank you for entrusting me with your care and for choosing [facility], Dr. [place] Cellar

## 2023-05-29 HISTORY — PX: CARDIAC CATHETERIZATION: SHX172

## 2023-06-09 ENCOUNTER — Ambulatory Visit (INDEPENDENT_AMBULATORY_CARE_PROVIDER_SITE_OTHER): Payer: 59 | Admitting: Gastroenterology

## 2023-06-09 ENCOUNTER — Encounter: Payer: Self-pay | Admitting: Gastroenterology

## 2023-06-09 VITALS — BP 124/80 | HR 88 | Ht 70.0 in | Wt 223.0 lb

## 2023-06-09 DIAGNOSIS — R1310 Dysphagia, unspecified: Secondary | ICD-10-CM | POA: Diagnosis not present

## 2023-06-09 DIAGNOSIS — K2270 Barrett's esophagus without dysplasia: Secondary | ICD-10-CM

## 2023-06-09 DIAGNOSIS — R1013 Epigastric pain: Secondary | ICD-10-CM | POA: Diagnosis not present

## 2023-06-09 DIAGNOSIS — Z955 Presence of coronary angioplasty implant and graft: Secondary | ICD-10-CM | POA: Diagnosis not present

## 2023-06-09 MED ORDER — PANTOPRAZOLE SODIUM 40 MG PO TBEC: 40.0000 mg | DELAYED_RELEASE_TABLET | Freq: Two times a day (BID) | ORAL | 1 refills | Status: DC

## 2023-06-09 MED ORDER — SUCRALFATE 1 GM/10ML PO SUSP: 1.0000 g | Freq: Four times a day (QID) | ORAL | 1 refills | Status: DC | PRN

## 2023-06-09 NOTE — H&P (View-Only) (Signed)
 HPI :  47 year old male known to me for history of GERD and Barrett's, here for a follow-up visit.  He was last seen November 2023.  Recall he has had an EGD in April 2021 showing a short segment of Barrett's esophagus.  He had a history of chronic reflux, been on numerous PPIs in the past and Protonix has worked pretty well for him more than other regimens.  He has been on 40 mg of Protonix baseline since have seen him.  He reports having had RSV last September which led to pneumonia.  He states he was having significant coughing for several months after that occurrence.  Starting about 5 to 6 months ago he developed epigastric/lower sternal discomfort that initially bothered him intermittently.  Over time this has bothered him more frequently and he essentially has felt constant discomfort for at least the past month or so.  Discomfort is located in his epigastric to lower sternal area and radiates over the left upper chest/left upper quadrant.  He can also feel it in his shoulder at times.  He states the pain level can rate anywhere between 5 and 10 out of 10.  The pain has been "incapacitating" at times and in late October, October 24, he went to the emergency room and was found to have mild troponin elevation.  He was taken for cardiac catheterization on October 25 and had his right coronary artery stent did, reportedly had a 90% occlusion.  He states he did not have a heart attack at the time.  He was discharged on aspirin and Plavix.  Echocardiogram done showed an EF of 50 to 55%.  He reported having the stenting and his heart made no difference to his symptoms and they persisted, he presented again to the emergency room on October 31.  He had a CT scan chest abdomen pelvis which showed no cause for symptoms.  Troponins were negative, no concerning findings for stent occlusion or heart attack and he was discharged home.  He reports he continues to feel quite poorly and his symptoms have been  worsening.  He states he feels as if there is a "sword" that is stabbing him in his epigastric to left chest area and into his left shoulder.  He states what makes it worse is when he swallows anything.  The active swallowing his saliva or food will make the pain much worse for him.  He has not been eating or drinking too well lately due to the symptoms which are quite bothersome.  In the ED they gave him lidocaine which completely resolved the symptoms for about 4 hours.  At home he has been using a lot of Mylanta and lidocaine swallows which does help to control and allow him to function although he states he cannot work in his current state and has been out of work since this first hospitalization.  He is a [employer].  He also feels that lying on his back flat makes things worse and he has to sleep on his side to minimize the symptoms.  He continues to take Protonix 40 mg once daily, has not tried increasing his dosing yet.  He has not had any dysphagia with this.  He denies any worsening reflux symptoms or heartburn otherwise.  States that has historically been pretty well-controlled with Protonix 40 mg daily.  He remains on aspirin and Plavix.  He is asking me for help to fill out paperwork for short-term disability for his job.  Prior workup:  EGD 08/02/2015 - The esophagus was normal. DH and GEJ located 40cm from the incisors. The SCJ was irregular with a roughly 1cm or less segment of salmon colored mucosa extending proximally from the GEJ. No nodularity was appreciated. Biopsies were taken to rule out Barrett's. The examined stomach was normal without ulcerations or inflammatory changes. Biopsies were taken to rule out H pylori. The duodenal bulb and 2nd portion of the duodenum were normal. Retroflexed views revealed no abnormalities. The scope was then withdrawn from the patient and the procedure completed.   EGD 11/15/19 -  - A 1 cm hiatal hernia was present. - The Z-line was slightly  irregular, short tongue of salmon colored mucosa extending up from the GEJ. Seemed shorter than when previously noted in 2016 ( now less < 1cm). Biopsies were taken with a cold forceps for histology given findings on the last exam. - The exam of the esophagus was otherwise normal. - A few small sessile polyps were found in the gastric body, grossly consistent with benign fundic gland polyps. - The exam of the stomach was otherwise normal. - The duodenal bulb and second portion of the duodenum were normal.   Surgical [P], distal esophagus - BARRETT ESOPHAGUS - NO DYSPLASIA OR MALIGNANCY IDENTIFIED - SEE COMMENT      CT scan abdomen / pelvis without contrast 01/24/22: IMPRESSION: There is 6 mm calculus in the proximal right ureter close to the ureteropelvic junction causing mild right hydronephrosis. Multiple bilateral renal stones. There is no evidence intestinal obstruction or pneumoperitoneum. Other findings as described in the body of the report.   Renal function normal - last BMET June 2023  EF 50-55% on echo during inpatient stay   Seen in the ED 06/04/23: CT chest / abdomen / pelvis: Impression  1. No acute noncontrast CT findings in the chest, abdomen or pelvis. 2. Interval increased linear scarring or atelectasis in the lungs. 3. Aortic and coronary artery atherosclerosis. Suspected interval stenting of the right coronary artery. 4. Nonobstructive bilateral nephrolithiasis. 5. Small umbilical and bilateral inguinal fat hernias.  Hepatobiliary: No focal liver abnormality is seen without contrast.  Status post cholecystectomy. No biliary dilatation.   Pancreas: No focal abnormality is seen without contrast.   Spleen: No focal abnormality is seen without contrast. No  splenomegaly.   Adrenals/Urinary Tract: There is no adrenal mass. There are multiple  scattered nonobstructive caliceal stones in the bilateral kidneys,  largest on the left is 2 mm in the inferior  pole, largest on the  right is 4 mm in the midpole.   Bilateral perinephric stranding appears similar to the most recent  exam. There is no contour deforming abnormality of either kidney.   There are no ureteral stones or hydronephrosis. No bladder  thickening.   Stomach/Bowel: Stomach is within normal limits. Appendix appears  normal. No evidence of bowel wall thickening, distention, or  inflammatory changes.   Vascular/Lymphatic: Minimal aortic atherosclerosis. No enlarged  abdominal or pelvic lymph nodes.   Reproductive: Prostate is unremarkable.   Other: Small umbilical and bilateral inguinal fat hernias. No  incarcerated hernia. No free hemorrhage, free fluid or free air. No  localizing collections or focal inflammatory process.   Musculoskeletal: No acute or significant osseous findings.  Transitional anatomy noted lumbosacral junction.    Past Medical History:  Diagnosis Date   Allergy    Barrett's esophagus    Coronary artery disease    ANEUYSM IN VEIN ON HEART   Gastritis  GERD (gastroesophageal reflux disease)    Hiatal hernia    NO LONGER PRESENT WITH LAST ENDOSCOPY   HLD (hyperlipidemia)    Hyperlipidemia    IBS (irritable bowel syndrome)    Kidney stones    NSTEMI (non-ST elevated myocardial infarction) [facility])      Past Surgical History:  Procedure Laterality Date   CARDIAC CATHETERIZATION  05/29/2023   [facility]   CHOLECYSTECTOMY N/A 01/03/2013   Procedure: LAPAROSCOPIC CHOLECYSTECTOMY;  Surgeon: Axel Filler, MD;  Location: [facility] OR;  Service: General;  Laterality: N/A;   LEFT HEART CATHETERIZATION WITH CORONARY ANGIOGRAM N/A 09/02/2011   Procedure: LEFT HEART CATHETERIZATION WITH CORONARY ANGIOGRAM;  Surgeon: Pamella Pert, MD;  Location: [facility] CATH LAB;  Service: Cardiovascular;  Laterality: N/A;   TYMPANOSTOMY TUBE PLACEMENT     UPPER GASTROINTESTINAL ENDOSCOPY     Family History  Problem Relation Age of Onset   Heart disease Mother    Colon  polyps Mother    Colon polyps Father    Barrett's esophagus Father    Heart attack Father    Heart disease Brother    Heart disease Maternal Grandmother    Pancreatic cancer Paternal Grandfather    Heart disease Maternal Aunt        multiple   Heart disease Maternal Uncle        multiple   Colon cancer Neg Hx    Esophageal cancer Neg Hx    Prostate cancer Neg Hx    Rectal cancer Neg Hx    Stomach cancer Neg Hx    Social History   Tobacco Use   Smoking status: Former    Current packs/day: 0.00    Average packs/day: 2.0 packs/day for 14.0 years (28.0 ttl pk-yrs)    Types: Cigarettes    Start date: 11/18/1989    Quit date: 11/19/2003    Years since quitting: 19.5   Smokeless tobacco: Current    Types: Chew   Tobacco comments:    7 years ago as of 2012  Vaping Use   Vaping status: Never Used  Substance Use Topics   Alcohol use: Yes    Alcohol/week: 0.0 standard drinks of alcohol    Comment: occas   Drug use: No   Current Outpatient Medications  Medication Sig Dispense Refill   Acetaminophen (TYLENOL PO) Take by mouth as needed.     aspirin EC 81 MG tablet Take 81 mg by mouth daily.     clopidogrel (PLAVIX) 75 MG tablet Take 1 tablet by mouth daily.     mometasone (NASONEX) 50 MCG/ACT nasal spray Place 2 sprays into the nose daily.     nitroGLYCERIN (NITROSTAT) 0.4 MG SL tablet Place 0.4 mg under the tongue every 5 (five) minutes as needed. For chest pain     pantoprazole (PROTONIX) 40 MG tablet Take 1 tablet (40 mg total) by mouth daily. 90 tablet 3   rosuvastatin (CRESTOR) 40 MG tablet Take 40 mg by mouth daily.     No current facility-administered medications for this visit.   Allergies  Allergen Reactions   Aciphex [Rabeprazole Sodium]    Percocet [Oxycodone-Acetaminophen] Itching   Zocor [Simvastatin]     REACTION: muscle aches, eye symptoms, fatigue   Rabeprazole Sodium Rash    "aciphex" causes whelts     Review of Systems: All systems reviewed and  negative except where noted in HPI.   Labs reviewed in Epic  Physical Exam: BP 124/80   Pulse 88   Ht 5\' 10"  (  1.778 m)   Wt 223 lb (101.2 kg)   SpO2 96%   BMI 32.00 kg/m  Constitutional: Pleasant,well-developed, male in no acute distress. Abdominal: Soft, nondistended, mild tenderness to deep palpation but not superficial There are no masses palpable.  Extremities: no edema Neurological: Alert and oriented to person place and time. Psychiatric: Normal mood and affect. Behavior is normal.   ASSESSMENT: 47 y.o. male here for assessment of the following  1. Abdominal pain, epigastric   2. Odynophagia   3. Barrett's esophagus without dysplasia   4. S/P drug eluting coronary stent placement    As above patient with severe odynophagia and intermittent associated epigastric and substernal discomfort ongoing for months intermittently however progressive worsening within the last 30 days.  Led to emergency room visit and cardiac cath where he had stenting of the right coronary artery on October 25.  Despite that intervention it has not provided any relief for his symptoms at all.  Leander Rams presented to the ED with severe discomfort, had CT scan chest abdomen pelvis without clear cause and was discharged home.  His labs have been normal.  Taking swallowed viscous lidocaine and Mylanta has provided the only relief for him so far and has been using that intermittently throughout the day to allow him to function.  He has not been able to work at all with this level of discomfort.  His exam shows only tenderness to deep palpation, but he appears uncomfortable and clearly this is related to his eating.  This is very likely distal esophageal or proximal stomach in etiology, suspect perhaps he has ulceration there.  Esophageal candidiasis is also on the differential.  He has a history of Barrett's and biopsies showed no high risk or concerning lesions however EGD is warranted to exclude any malignancy at  the GEJ in light of his Barrett's, assess for ulceration, assess for candidiasis or other infectious etiology that can present like this.  The question is when we can perform his endoscopy at the [facility] in light of his recent coronary stenting or if his case needs to be done at the hospital.  No problem with doing an outpatient case at the hospital is at we do not have any openings for the next 2 to 3 months we will have to figure out logistics of what we can do that as soon as we can.  Otherwise I am going to discuss his case with our anesthesia providers at the [facility], if just coronary stenting isolated he would be a candidate to have this done 1 month after his stent placement.  His EF is normal.  If he was considered to have a true heart attack then we may not be able to do his case as outpatient for 3 months or so.  I will need to sort out logistics and timing of what we can do his procedure in the next few days.  In the interim, recommend we increase his Protonix to 40 mg twice daily.  I will also start him empirically on liquid Carafate 10 cc every 6 hours as needed.  He can continue Mylanta and viscous lidocaine in the interim for relief.  We also discussed empirically placing him on a course of fluconazole, he wants to hold off on that for now, he has no risk factors for that.  Of note he asked for my help completing paperwork for short-term disability application which took several minutes and prolonged the visit today  PLAN: - needs EGD, question is  when in light of coronary stent placement. Will discuss with anesthesia to see if / when we can do his case at [facility] (within 1 month?) or if needs to be done at the hospital - increase protonix to 40mg  twice daily - new prescription given - add liquid carafate 10cc every 6 hours PRN. 1 bottle with a refill - continue Mylanta or lidocaine swallows PRN - discussed empiric fluconazole - will hold off for now - completed short term disability  paperwork  Will get in touch with him shortly about where / when we can do his EGD. Call in the interim with any worsening.  60 minutes spent total with patient and completing paperwork for disability / employer  Harlin Rain, MD [facility]

## 2023-06-09 NOTE — Progress Notes (Signed)
HPI :  47 year old male known to me for history of GERD and Barrett's, here for a follow-up visit.  He was last seen November 2023.  Recall he has had an EGD in April 2021 showing a short segment of Barrett's esophagus.  He had a history of chronic reflux, been on numerous PPIs in the past and Protonix has worked pretty well for him more than other regimens.  He has been on 40 mg of Protonix baseline since have seen him.  He reports having had RSV last September which led to pneumonia.  He states he was having significant coughing for several months after that occurrence.  Starting about 5 to 6 months ago he developed epigastric/lower sternal discomfort that initially bothered him intermittently.  Over time this has bothered him more frequently and he essentially has felt constant discomfort for at least the past month or so.  Discomfort is located in his epigastric to lower sternal area and radiates over the left upper chest/left upper quadrant.  He can also feel it in his shoulder at times.  He states the pain level can rate anywhere between 5 and 10 out of 10.  The pain has been "incapacitating" at times and in late October, October 24, he went to the emergency room and was found to have mild troponin elevation.  He was taken for cardiac catheterization on October 25 and had his right coronary artery stent did, reportedly had a 90% occlusion.  He states he did not have a heart attack at the time.  He was discharged on aspirin and Plavix.  Echocardiogram done showed an EF of 50 to 55%.  He reported having the stenting and his heart made no difference to his symptoms and they persisted, he presented again to the emergency room on October 31.  He had a CT scan chest abdomen pelvis which showed no cause for symptoms.  Troponins were negative, no concerning findings for stent occlusion or heart attack and he was discharged home.  He reports he continues to feel quite poorly and his symptoms have been  worsening.  He states he feels as if there is a "sword" that is stabbing him in his epigastric to left chest area and into his left shoulder.  He states what makes it worse is when he swallows anything.  The active swallowing his saliva or food will make the pain much worse for him.  He has not been eating or drinking too well lately due to the symptoms which are quite bothersome.  In the ED they gave him lidocaine which completely resolved the symptoms for about 4 hours.  At home he has been using a lot of Mylanta and lidocaine swallows which does help to control and allow him to function although he states he cannot work in his current state and has been out of work since this first hospitalization.  He is a [employer].  He also feels that lying on his back flat makes things worse and he has to sleep on his side to minimize the symptoms.  He continues to take Protonix 40 mg once daily, has not tried increasing his dosing yet.  He has not had any dysphagia with this.  He denies any worsening reflux symptoms or heartburn otherwise.  States that has historically been pretty well-controlled with Protonix 40 mg daily.  He remains on aspirin and Plavix.  He is asking me for help to fill out paperwork for short-term disability for his job.  Prior workup:  EGD 08/02/2015 - The esophagus was normal. DH and GEJ located 40cm from the incisors. The SCJ was irregular with a roughly 1cm or less segment of salmon colored mucosa extending proximally from the GEJ. No nodularity was appreciated. Biopsies were taken to rule out Barrett's. The examined stomach was normal without ulcerations or inflammatory changes. Biopsies were taken to rule out H pylori. The duodenal bulb and 2nd portion of the duodenum were normal. Retroflexed views revealed no abnormalities. The scope was then withdrawn from the patient and the procedure completed.   EGD 11/15/19 -  - A 1 cm hiatal hernia was present. - The Z-line was slightly  irregular, short tongue of salmon colored mucosa extending up from the GEJ. Seemed shorter than when previously noted in 2016 ( now less < 1cm). Biopsies were taken with a cold forceps for histology given findings on the last exam. - The exam of the esophagus was otherwise normal. - A few small sessile polyps were found in the gastric body, grossly consistent with benign fundic gland polyps. - The exam of the stomach was otherwise normal. - The duodenal bulb and second portion of the duodenum were normal.   Surgical [P], distal esophagus - BARRETT ESOPHAGUS - NO DYSPLASIA OR MALIGNANCY IDENTIFIED - SEE COMMENT      CT scan abdomen / pelvis without contrast 01/24/22: IMPRESSION: There is 6 mm calculus in the proximal right ureter close to the ureteropelvic junction causing mild right hydronephrosis. Multiple bilateral renal stones. There is no evidence intestinal obstruction or pneumoperitoneum. Other findings as described in the body of the report.   Renal function normal - last BMET June 2023  EF 50-55% on echo during inpatient stay   Seen in the ED 06/04/23: CT chest / abdomen / pelvis: Impression  1. No acute noncontrast CT findings in the chest, abdomen or pelvis. 2. Interval increased linear scarring or atelectasis in the lungs. 3. Aortic and coronary artery atherosclerosis. Suspected interval stenting of the right coronary artery. 4. Nonobstructive bilateral nephrolithiasis. 5. Small umbilical and bilateral inguinal fat hernias.  Hepatobiliary: No focal liver abnormality is seen without contrast.  Status post cholecystectomy. No biliary dilatation.   Pancreas: No focal abnormality is seen without contrast.   Spleen: No focal abnormality is seen without contrast. No  splenomegaly.   Adrenals/Urinary Tract: There is no adrenal mass. There are multiple  scattered nonobstructive caliceal stones in the bilateral kidneys,  largest on the left is 2 mm in the inferior  pole, largest on the  right is 4 mm in the midpole.   Bilateral perinephric stranding appears similar to the most recent  exam. There is no contour deforming abnormality of either kidney.   There are no ureteral stones or hydronephrosis. No bladder  thickening.   Stomach/Bowel: Stomach is within normal limits. Appendix appears  normal. No evidence of bowel wall thickening, distention, or  inflammatory changes.   Vascular/Lymphatic: Minimal aortic atherosclerosis. No enlarged  abdominal or pelvic lymph nodes.   Reproductive: Prostate is unremarkable.   Other: Small umbilical and bilateral inguinal fat hernias. No  incarcerated hernia. No free hemorrhage, free fluid or free air. No  localizing collections or focal inflammatory process.   Musculoskeletal: No acute or significant osseous findings.  Transitional anatomy noted lumbosacral junction.    Past Medical History:  Diagnosis Date   Allergy    Barrett's esophagus    Coronary artery disease    ANEUYSM IN VEIN ON HEART   Gastritis  GERD (gastroesophageal reflux disease)    Hiatal hernia    NO LONGER PRESENT WITH LAST ENDOSCOPY   HLD (hyperlipidemia)    Hyperlipidemia    IBS (irritable bowel syndrome)    Kidney stones    NSTEMI (non-ST elevated myocardial infarction) [facility])      Past Surgical History:  Procedure Laterality Date   CARDIAC CATHETERIZATION  05/29/2023   [facility]   CHOLECYSTECTOMY N/A 01/03/2013   Procedure: LAPAROSCOPIC CHOLECYSTECTOMY;  Surgeon: Axel Filler, MD;  Location: [facility] OR;  Service: General;  Laterality: N/A;   LEFT HEART CATHETERIZATION WITH CORONARY ANGIOGRAM N/A 09/02/2011   Procedure: LEFT HEART CATHETERIZATION WITH CORONARY ANGIOGRAM;  Surgeon: Pamella Pert, MD;  Location: [facility] CATH LAB;  Service: Cardiovascular;  Laterality: N/A;   TYMPANOSTOMY TUBE PLACEMENT     UPPER GASTROINTESTINAL ENDOSCOPY     Family History  Problem Relation Age of Onset   Heart disease Mother    Colon  polyps Mother    Colon polyps Father    Barrett's esophagus Father    Heart attack Father    Heart disease Brother    Heart disease Maternal Grandmother    Pancreatic cancer Paternal Grandfather    Heart disease Maternal Aunt        multiple   Heart disease Maternal Uncle        multiple   Colon cancer Neg Hx    Esophageal cancer Neg Hx    Prostate cancer Neg Hx    Rectal cancer Neg Hx    Stomach cancer Neg Hx    Social History   Tobacco Use   Smoking status: Former    Current packs/day: 0.00    Average packs/day: 2.0 packs/day for 14.0 years (28.0 ttl pk-yrs)    Types: Cigarettes    Start date: 11/18/1989    Quit date: 11/19/2003    Years since quitting: 19.5   Smokeless tobacco: Current    Types: Chew   Tobacco comments:    7 years ago as of 2012  Vaping Use   Vaping status: Never Used  Substance Use Topics   Alcohol use: Yes    Alcohol/week: 0.0 standard drinks of alcohol    Comment: occas   Drug use: No   Current Outpatient Medications  Medication Sig Dispense Refill   Acetaminophen (TYLENOL PO) Take by mouth as needed.     aspirin EC 81 MG tablet Take 81 mg by mouth daily.     clopidogrel (PLAVIX) 75 MG tablet Take 1 tablet by mouth daily.     mometasone (NASONEX) 50 MCG/ACT nasal spray Place 2 sprays into the nose daily.     nitroGLYCERIN (NITROSTAT) 0.4 MG SL tablet Place 0.4 mg under the tongue every 5 (five) minutes as needed. For chest pain     pantoprazole (PROTONIX) 40 MG tablet Take 1 tablet (40 mg total) by mouth daily. 90 tablet 3   rosuvastatin (CRESTOR) 40 MG tablet Take 40 mg by mouth daily.     No current facility-administered medications for this visit.   Allergies  Allergen Reactions   Aciphex [Rabeprazole Sodium]    Percocet [Oxycodone-Acetaminophen] Itching   Zocor [Simvastatin]     REACTION: muscle aches, eye symptoms, fatigue   Rabeprazole Sodium Rash    "aciphex" causes whelts     Review of Systems: All systems reviewed and  negative except where noted in HPI.   Labs reviewed in Epic  Physical Exam: BP 124/80   Pulse 88   Ht 5\' 10"  (  1.778 m)   Wt 223 lb (101.2 kg)   SpO2 96%   BMI 32.00 kg/m  Constitutional: Pleasant,well-developed, male in no acute distress. Abdominal: Soft, nondistended, mild tenderness to deep palpation but not superficial There are no masses palpable.  Extremities: no edema Neurological: Alert and oriented to person place and time. Psychiatric: Normal mood and affect. Behavior is normal.   ASSESSMENT: 47 y.o. male here for assessment of the following  1. Abdominal pain, epigastric   2. Odynophagia   3. Barrett's esophagus without dysplasia   4. S/P drug eluting coronary stent placement    As above patient with severe odynophagia and intermittent associated epigastric and substernal discomfort ongoing for months intermittently however progressive worsening within the last 30 days.  Led to emergency room visit and cardiac cath where he had stenting of the right coronary artery on October 25.  Despite that intervention it has not provided any relief for his symptoms at all.  Leander Rams presented to the ED with severe discomfort, had CT scan chest abdomen pelvis without clear cause and was discharged home.  His labs have been normal.  Taking swallowed viscous lidocaine and Mylanta has provided the only relief for him so far and has been using that intermittently throughout the day to allow him to function.  He has not been able to work at all with this level of discomfort.  His exam shows only tenderness to deep palpation, but he appears uncomfortable and clearly this is related to his eating.  This is very likely distal esophageal or proximal stomach in etiology, suspect perhaps he has ulceration there.  Esophageal candidiasis is also on the differential.  He has a history of Barrett's and biopsies showed no high risk or concerning lesions however EGD is warranted to exclude any malignancy at  the GEJ in light of his Barrett's, assess for ulceration, assess for candidiasis or other infectious etiology that can present like this.  The question is when we can perform his endoscopy at the [facility] in light of his recent coronary stenting or if his case needs to be done at the hospital.  No problem with doing an outpatient case at the hospital is at we do not have any openings for the next 2 to 3 months we will have to figure out logistics of what we can do that as soon as we can.  Otherwise I am going to discuss his case with our anesthesia providers at the [facility], if just coronary stenting isolated he would be a candidate to have this done 1 month after his stent placement.  His EF is normal.  If he was considered to have a true heart attack then we may not be able to do his case as outpatient for 3 months or so.  I will need to sort out logistics and timing of what we can do his procedure in the next few days.  In the interim, recommend we increase his Protonix to 40 mg twice daily.  I will also start him empirically on liquid Carafate 10 cc every 6 hours as needed.  He can continue Mylanta and viscous lidocaine in the interim for relief.  We also discussed empirically placing him on a course of fluconazole, he wants to hold off on that for now, he has no risk factors for that.  Of note he asked for my help completing paperwork for short-term disability application which took several minutes and prolonged the visit today  PLAN: - needs EGD, question is  when in light of coronary stent placement. Will discuss with anesthesia to see if / when we can do his case at [facility] (within 1 month?) or if needs to be done at the hospital - increase protonix to 40mg  twice daily - new prescription given - add liquid carafate 10cc every 6 hours PRN. 1 bottle with a refill - continue Mylanta or lidocaine swallows PRN - discussed empiric fluconazole - will hold off for now - completed short term disability  paperwork  Will get in touch with him shortly about where / when we can do his EGD. Call in the interim with any worsening.  60 minutes spent total with patient and completing paperwork for disability / employer  Harlin Rain, MD [facility]

## 2023-06-09 NOTE — Patient Instructions (Addendum)
We are holding a spot for an EGD with Dr. Adela Lank on Tuesday, 11-26 at 10:00am.  We will notify you if you are cleared to have done in our Endoscopy Center or whether you will need to have your procedure at the hospital.   We have sent the following medications to your pharmacy for you to pick up at your convenience: Protonix 40 mg: Take twice daily Carafate suspension: Take 10 ml every 6 hours as needed  Continue Mylanta and Lidocaine swallows as needed  Thank you for entrusting me with your care and for choosing [facility], Dr. Ileene Patrick    If your blood pressure at your visit was 140/90 or greater, please contact your primary care physician to follow up on this. ______________________________________________________  If you are age 45 or older, your body mass index should be between 23-30. Your Body mass index is 32 kg/m. If this is out of the aforementioned range listed, please consider follow up with your Primary Care Provider.  If you are age 45 or younger, your body mass index should be between 19-25. Your Body mass index is 32 kg/m. If this is out of the aformentioned range listed, please consider follow up with your Primary Care Provider.  ________________________________________________________  The [facility] providers would like to encourage you to use [facility] to communicate with providers for non-urgent requests or questions.  Due to long hold times on the telephone, sending your provider a message by [facility] may be a faster and more efficient way to get a response.  Please allow 48 business hours for a response.  Please remember that this is for non-urgent requests.  _______________________________________________________  Due to recent changes in healthcare laws, you may see the results of your imaging and laboratory studies on MyChart before your provider has had a chance to review them.  We understand that in some cases there may be results that are  confusing or concerning to you. Not all laboratory results come back in the same time frame and the provider may be waiting for multiple results in order to interpret others.  Please give [place] 48 hours in order for your provider to thoroughly review all the results before contacting the office for clarification of your results.

## 2023-06-10 ENCOUNTER — Other Ambulatory Visit: Payer: Self-pay

## 2023-06-10 ENCOUNTER — Ambulatory Visit: Payer: Managed Care, Other (non HMO) | Admitting: Physician Assistant

## 2023-06-10 DIAGNOSIS — K2270 Barrett's esophagus without dysplasia: Secondary | ICD-10-CM

## 2023-06-10 DIAGNOSIS — R1310 Dysphagia, unspecified: Secondary | ICD-10-CM

## 2023-06-10 DIAGNOSIS — K219 Gastro-esophageal reflux disease without esophagitis: Secondary | ICD-10-CM

## 2023-06-10 DIAGNOSIS — Z79899 Other long term (current) drug therapy: Secondary | ICD-10-CM

## 2023-06-10 NOTE — Telephone Encounter (Signed)
LM for patient to see if he can have his EGD at [facility] on Monday, 11-18 at 7:30am. Asked patient to call back as soon as possible

## 2023-06-10 NOTE — Telephone Encounter (Signed)
PT wants to confirm the November EGD appointment. Please advise.

## 2023-06-10 NOTE — Progress Notes (Addendum)
EGD at [facility] Case scheduled for Monday, 11-18 at 7:30 am. Case# [id number]. Instructions have been printed and will be mailed to the patient, also sent via MyChart. Waiting to hear from provider as to whether patient will need to be cleared to hold Plavix prior to procedure.  Per Dr. Adela Lank patient will stay on Plavix for the procedure

## 2023-06-15 ENCOUNTER — Encounter (HOSPITAL_COMMUNITY): Payer: Self-pay | Admitting: Gastroenterology

## 2023-06-15 ENCOUNTER — Telehealth: Payer: Self-pay

## 2023-06-15 NOTE — Telephone Encounter (Signed)
[facility] [address] [place], [place]  [address] Phone:  [phone]   Fax:  [phone]     06/15/2023   RE:      Thomas Dixon DOB:   18-Sep-1975 MRN:   [id number]   Dear Dr. Beverely Pace ([facility]),    We have scheduled the above patient for an endoscopic procedure (EGD) at [facility] on Monday, 06-22-23 with Dr. Ileene Patrick of [facility].   Please advise if patient has Cardiac Clearance to proceed.   Please fax back/ or route the completed form to Berlinda Last, CMA at [phone].  We can be reached at [phone]. Thank you for your prompt reply.   Sincerely,       Dillard Cannon, CMA for Dr[facility]

## 2023-06-15 NOTE — Telephone Encounter (Signed)
Faxed to [phone]

## 2023-06-15 NOTE — Progress Notes (Signed)
Pre op call eval Name:Thomas Dixon  PCP-Hopper,William MD Cardiologist- Dr.Cheek ([facility])  EKG-06/03/23 Echo-05/29/23 Cath-05/29/23 Stress-n/a ICD/PM-n/a Blood thinner- Plavix- to remain on, confirmed with GI office GLP-1- n/a  Hx:CAD, MI, Barretts Esophagus. Was having some chest pains in October 2024, had a heart cath 10/25 showed 90% occlusion, stent placed, was put on plavix at that time. Also called into cardiologist 10/31 was still having chest pains, at that time went to ED and ruled it wasnt cardiac related, they thought it was GI related. Patient spoke to GI and they started on GERD medicine, per pt that has calmed it down but still slightly having those pains. He said he is due to see his cardiologist Friday this week (15th).  Anesthesia Review: Yes

## 2023-06-18 NOTE — Telephone Encounter (Signed)
Received fax from Dr. Ledell Noss : "This patient is low risk for the above planned procedure".  Dr. Adela Lank has reviewed the assessment. Patient will proceed with endoscopic procedure on Monday, 11-18 at [facility] and will stay on Plavix for the procedure.

## 2023-06-18 NOTE — Telephone Encounter (Signed)
Called [facility] at [phone]. Spoke to [place]. They received the request . The patient has an appointment tomorrow with She said she would send a message to Dr. Beverely Pace to ask if they will address the request at the appointment. Asked if he could respond in writing and fax back to [phone].

## 2023-06-19 ENCOUNTER — Other Ambulatory Visit (HOSPITAL_COMMUNITY): Payer: Self-pay

## 2023-06-21 NOTE — Anesthesia Preprocedure Evaluation (Signed)
Anesthesia Evaluation  Patient identified by MRN, date of birth, ID band Patient awake    History of Anesthesia Complications (+) PONV  Airway Mallampati: I  TM Distance: >3 FB Neck ROM: Full    Dental  (+) Teeth Intact, Dental Advisory Given   Pulmonary neg pulmonary ROS, former smoker   Pulmonary exam normal breath sounds clear to auscultation       Cardiovascular + CAD  Normal cardiovascular exam Rhythm:Regular Rate:Normal  Hx of RCA aneurysm   Neuro/Psych  Neuromuscular disease  negative psych ROS   GI/Hepatic hiatal hernia,GERD  Medicated,,  Endo/Other    Renal/GU   negative genitourinary   Musculoskeletal negative musculoskeletal ROS (+)    Abdominal  (+) + obese  Peds  Hematology negative hematology ROS (+)   Anesthesia Other Findings   Reproductive/Obstetrics negative OB ROS                             Anesthesia Physical Anesthesia Plan  ASA: II  Anesthesia Plan: MAC   Post-op Pain Management:    Induction: Intravenous  PONV Risk Score and Plan: 2 and Propofol infusion and TIVA  Airway Management Planned: Natural Airway, Simple Face Mask and Nasal Cannula  Additional Equipment: None  Intra-op Plan:   Post-operative Plan:   Informed Consent: I have reviewed the patients History and Physical, chart, labs and discussed the procedure including the risks, benefits and alternatives for the proposed anesthesia with the patient or authorized representative who has indicated his/her understanding and acceptance.       Plan Discussed with: CRNA  Anesthesia Plan Comments:         Anesthesia Quick Evaluation

## 2023-06-22 ENCOUNTER — Telehealth: Payer: Self-pay

## 2023-06-22 ENCOUNTER — Ambulatory Visit (HOSPITAL_COMMUNITY): Payer: 59 | Admitting: Anesthesiology

## 2023-06-22 ENCOUNTER — Other Ambulatory Visit: Payer: Self-pay

## 2023-06-22 ENCOUNTER — Encounter (HOSPITAL_COMMUNITY)
Admission: RE | Disposition: A | Discharge status: Home / Self Care | Payer: Self-pay | Source: Home / Self Care | Attending: Gastroenterology

## 2023-06-22 ENCOUNTER — Encounter (HOSPITAL_COMMUNITY): Payer: Self-pay | Admitting: Gastroenterology

## 2023-06-22 ENCOUNTER — Ambulatory Visit (HOSPITAL_COMMUNITY)
Admission: RE | Admit: 2023-06-22 | Discharge: 2023-06-22 | Disposition: A | Discharge status: Home / Self Care | Payer: 59 | Attending: Gastroenterology | Admitting: Gastroenterology

## 2023-06-22 ENCOUNTER — Ambulatory Visit (HOSPITAL_BASED_OUTPATIENT_CLINIC_OR_DEPARTMENT_OTHER): Payer: 59 | Admitting: Anesthesiology

## 2023-06-22 DIAGNOSIS — I2510 Atherosclerotic heart disease of native coronary artery without angina pectoris: Secondary | ICD-10-CM | POA: Diagnosis not present

## 2023-06-22 DIAGNOSIS — Z87891 Personal history of nicotine dependence: Secondary | ICD-10-CM | POA: Diagnosis not present

## 2023-06-22 DIAGNOSIS — K449 Diaphragmatic hernia without obstruction or gangrene: Secondary | ICD-10-CM | POA: Insufficient documentation

## 2023-06-22 DIAGNOSIS — R1310 Dysphagia, unspecified: Secondary | ICD-10-CM

## 2023-06-22 DIAGNOSIS — K2270 Barrett's esophagus without dysplasia: Secondary | ICD-10-CM

## 2023-06-22 DIAGNOSIS — K2100 Gastro-esophageal reflux disease with esophagitis, without bleeding: Secondary | ICD-10-CM

## 2023-06-22 DIAGNOSIS — K2289 Other specified disease of esophagus: Secondary | ICD-10-CM | POA: Diagnosis not present

## 2023-06-22 DIAGNOSIS — K219 Gastro-esophageal reflux disease without esophagitis: Secondary | ICD-10-CM | POA: Diagnosis not present

## 2023-06-22 DIAGNOSIS — R1013 Epigastric pain: Secondary | ICD-10-CM | POA: Diagnosis not present

## 2023-06-22 DIAGNOSIS — R0789 Other chest pain: Secondary | ICD-10-CM | POA: Diagnosis not present

## 2023-06-22 DIAGNOSIS — Z79899 Other long term (current) drug therapy: Secondary | ICD-10-CM

## 2023-06-22 HISTORY — PX: ESOPHAGOGASTRODUODENOSCOPY (EGD) WITH PROPOFOL: SHX5813

## 2023-06-22 HISTORY — PX: BIOPSY: SHX5522

## 2023-06-22 SURGERY — ESOPHAGOGASTRODUODENOSCOPY (EGD) WITH PROPOFOL
Anesthesia: Monitor Anesthesia Care

## 2023-06-22 MED ORDER — PROPOFOL 500 MG/50ML IV EMUL
INTRAVENOUS | Status: DC | PRN
  Administered 2023-06-22: 150 ug/kg/min via INTRAVENOUS

## 2023-06-22 MED ORDER — PROPOFOL 500 MG/50ML IV EMUL
INTRAVENOUS | Status: AC
  Filled 2023-06-22: qty 50

## 2023-06-22 MED ORDER — SODIUM CHLORIDE 0.9 % IV SOLN
INTRAVENOUS | Status: DC
Start: 2023-06-22 — End: 2023-06-22

## 2023-06-22 MED ORDER — LIDOCAINE HCL 1 % IJ SOLN
INTRAMUSCULAR | Status: DC | PRN
  Administered 2023-06-22: 60 mg via INTRADERMAL
  Administered 2023-06-22: 40 mg via INTRADERMAL

## 2023-06-22 MED ORDER — SODIUM CHLORIDE 0.9 % IV SOLN: INTRAVENOUS | Status: DC | PRN

## 2023-06-22 MED ORDER — PROPOFOL 10 MG/ML IV BOLUS
INTRAVENOUS | Status: DC | PRN
  Administered 2023-06-22: 30 mg via INTRAVENOUS
  Administered 2023-06-22: 20 mg via INTRAVENOUS

## 2023-06-22 MED ORDER — SUCRALFATE 1 G PO TABS: 1.0000 g | ORAL_TABLET | Freq: Four times a day (QID) | ORAL | 1 refills | Status: AC | PRN

## 2023-06-22 SURGICAL SUPPLY — 15 items

## 2023-06-22 NOTE — Telephone Encounter (Signed)
Pharmacy Patient Advocate Encounter   Received notification from CoverMyMeds that prior authorization for Pantoprazole 40 mg tablets is required/requested.   Insurance verification completed.   The patient is insured through [employer] [facility] .   Per test claim: PA required; PA submitted to above mentioned insurance via CoverMyMeds Key/confirmation #/EOC [id number] Status is pending

## 2023-06-22 NOTE — Telephone Encounter (Signed)
Changed Carafate to tablets.  Called Pharmacy.  Insurance only wants to pay for once daily pantoprazole.

## 2023-06-22 NOTE — Discharge Instructions (Signed)

## 2023-06-22 NOTE — Op Note (Signed)
[facility] Patient Name: Thomas Dixon Procedure Date: 06/22/2023 MRN: [id number] Attending MD: Willaim Rayas. Adela Lank , MD, [id number] Date of Birth: 1976/02/21 CSN: [id number] Age: 47 Admit Type: Outpatient Procedure:                Upper GI endoscopy Indications:              Odynophagia, Chest pain (non cardiac) - post                            coronary stenting on aspirin and Plavix. Now on                            higher dose protonix 40mg  BID and carafate which                            has significantly helped symptoms. History of short                            segment Barrett's Providers:                Willaim Rayas. Adela Lank, MD, Marge Duncans, RN,                            Rozetta Nunnery, Technician, Derinda Late,                            Technician Referring MD:              Medicines:                Monitored Anesthesia Care Complications:            No immediate complications. Estimated blood loss:                            Minimal. Estimated Blood Loss:     Estimated blood loss was minimal. Procedure:                Pre-Anesthesia Assessment:                           - Prior to the procedure, a History and Physical                            was performed, and patient medications and                            allergies were reviewed. The patient's tolerance of                            previous anesthesia was also reviewed. The risks                            and benefits of the procedure and the sedation                            options and risks were  discussed with the patient.                            All questions were answered, and informed consent                            was obtained. Prior Anticoagulants: The patient has                            taken Plavix (clopidogrel), last dose was 1 day                            prior to procedure. ASA Grade Assessment: III - A                            patient with severe  systemic disease. After                            reviewing the risks and benefits, the patient was                            deemed in satisfactory condition to undergo the                            procedure.                           After obtaining informed consent, the endoscope was                            passed under direct vision. Throughout the                            procedure, the patient's blood pressure, pulse, and                            oxygen saturations were monitored continuously. The                            [id number]-H190 ([id number]) Olympus endoscope was introduced                            through the mouth, and advanced to the second part                            of duodenum. The upper GI endoscopy was                            accomplished without difficulty. The patient                            tolerated the procedure well. Scope In: Scope Out: Findings:      Esophagogastric landmarks were identified: the Z-line was found at 41       cm,  the gastroesophageal junction was found at 40 cm and the upper       extent of the gastric folds was found at 43 cm from the incisors.      A small hiatal hernia was present (sliding 1-2cm hernia).      The Z-line was irregular. No nodularity or ulceration. Biopsies were       taken with a cold forceps for histology.      The exam of the esophagus was otherwise normal. No clear ulcerations,       mass lesions, or mucosal pathology noted to cause symptoms.      The entire examined stomach was normal. Of note, proximal stomach did       not insufflate well due to repeated belching of air with insufflation.       Several minutes taken to get a good view of the cardia, no pathology       seen there to cause symptoms.      The examined duodenum was normal. Impression:               - Esophagogastric landmarks identified.                           - Small sliding hiatal hernia.                           - Z-line  irregular. Biopsied.                           - Normal esophagus otherwise.                           - Normal stomach. Poor insufflation of proximal                            stomach.                           - Normal examined duodenum.                           Possible patient had esophageal ulceration that has                            since healed with PPI / carafate. Nothing obviously                            ulcerated on this exam. Moderate Sedation:      No moderate sedation, case performed with MAC Recommendation:           - Patient has a contact number available for                            emergencies. The signs and symptoms of potential                            delayed complications were discussed with the  patient. Return to normal activities tomorrow.                            Written discharge instructions were provided to the                            patient.                           - Resume previous diet.                           - Continue present medications (protonix 40mg  twice                            daily and carafate for now, if helping)                           - Await pathology results. Procedure Code(s):        --- Professional ---                           [phone], Esophagogastroduodenoscopy, flexible,                            transoral; with biopsy, single or multiple Diagnosis Code(s):        --- Professional ---                           K44.9, Diaphragmatic hernia without obstruction or                            gangrene                           K22.89, Other specified disease of esophagus                           R13.10, Dysphagia, unspecified                           R07.89, Other chest pain CPT copyright 2022 American Medical Association. All rights reserved. The codes documented in this report are preliminary and upon coder review may  be revised to meet current compliance requirements. Viviann Spare P.  Onnika Siebel, MD 06/22/2023 8:03:39 AM This report has been signed electronically. Number of Addenda: 0

## 2023-06-22 NOTE — Interval H&P Note (Signed)
History and Physical Interval Note: Seen on 06/09/23 - now on protonix 40mg  BID, carafate and he reports significant improvement in chest pain / odynophagia. Improved but not resolved, discomfort now around 2/10. He remains on aspirin and Plavix, here for EGD today to evaluate while ON Plavix. He has seen cardiology recently and cleared for this procedure, they did not want to interrupt his Plavix use. He denies any chest pains or shortness of breath. Otherwise denies complaints today and he wishes to proceed. All questions answered.   06/22/2023 7:15 AM  Thomas Dixon  has presented today for surgery, with the diagnosis of odynophagia.  The various methods of treatment have been discussed with the patient and family. After consideration of risks, benefits and other options for treatment, the patient has consented to  Procedure(s): ESOPHAGOGASTRODUODENOSCOPY (EGD) WITH PROPOFOL (N/A) as a surgical intervention.  The patient's history has been reviewed, patient examined, no change in status, stable for surgery.  I have reviewed the patient's chart and labs.  Questions were answered to the patient's satisfaction.     Viviann Spare P Rafaelita Foister

## 2023-06-22 NOTE — Telephone Encounter (Signed)
-----   Message from Thomas Dixon sent at 06/22/2023  8:12 AM EST ----- Regarding: meds for this patient Jan can you help with the following: - change liquid carafate to cafate tablet - 1 tab every 6 hours PRN - dissolve with water to make slurry. One month supply with refill - he should continue protonix 40mg  BID - sounds like there is an insurance issue with coverage for this, not sure if they can try another pharmacy or if we need to appeal it? He does not want to switch protonix to another formulation given he is on plavix. thanks

## 2023-06-22 NOTE — Transfer of Care (Signed)
Immediate Anesthesia Transfer of Care Note  Patient: Thomas Dixon  Procedure(s) Performed: ESOPHAGOGASTRODUODENOSCOPY (EGD) WITH PROPOFOL BIOPSY  Patient Location: PACU and Endoscopy Unit  Anesthesia Type:MAC  Level of Consciousness: oriented, drowsy, and patient cooperative  Airway & Oxygen Therapy: Patient Spontanous Breathing and Patient connected to face mask oxygen  Post-op Assessment: Report given to RN and Post -op Vital signs reviewed and stable  Post vital signs: Reviewed and stable  Last Vitals:  Vitals Value Taken Time  BP 101/68 06/22/23 0801  Temp 36.7 C 06/22/23 0801  Pulse 85 06/22/23 0802  Resp 23 06/22/23 0802  SpO2 98 % 06/22/23 0802  Vitals shown include unfiled device data.  Last Pain:  Vitals:   06/22/23 0801  TempSrc:   PainSc: Asleep         Complications: No notable events documented.

## 2023-06-22 NOTE — Anesthesia Postprocedure Evaluation (Signed)
Anesthesia Post Note  Patient: Thomas Dixon  Procedure(s) Performed: ESOPHAGOGASTRODUODENOSCOPY (EGD) WITH PROPOFOL BIOPSY     Patient location during evaluation: Endoscopy Anesthesia Type: MAC Level of consciousness: awake and sedated Pain management: pain level controlled Vital Signs Assessment: post-procedure vital signs reviewed and stable Respiratory status: spontaneous breathing Cardiovascular status: stable Postop Assessment: no apparent nausea or vomiting Anesthetic complications: no  No notable events documented.  Last Vitals:  Vitals:   06/22/23 0802 06/22/23 0810  BP: 103/64 111/70  Pulse: 85 84  Resp: (!) 23 15  Temp:    SpO2: 98% 93%    Last Pain:  Vitals:   06/22/23 0810  TempSrc:   PainSc: 0-No pain                 Caren Macadam

## 2023-06-23 ENCOUNTER — Encounter (HOSPITAL_COMMUNITY): Payer: Self-pay | Admitting: Gastroenterology

## 2023-06-23 LAB — SURGICAL PATHOLOGY

## 2023-06-23 NOTE — Telephone Encounter (Signed)
Called pharmacy.  It has been approved and is ready for pick up.  Copay $7.  Called and informed patient

## 2023-06-29 ENCOUNTER — Encounter: Payer: Self-pay | Admitting: Gastroenterology

## 2023-06-30 ENCOUNTER — Encounter: Payer: 59 | Admitting: Gastroenterology

## 2023-10-26 ENCOUNTER — Telehealth: Payer: Self-pay

## 2023-10-26 NOTE — Telephone Encounter (Signed)
 Copied from CRM [id number]. Topic: Appointments - Scheduling Inquiry for Clinic >> Oct 26, 2023 10:40 AM Theodis Sato wrote: Reason for CRM: Patients mother is requesting the clinic to ask Dr. Janee Morn if he could possibly see this patient before 5/1 as he had a heart attack in October and is very fatigued with varying high blood pressure readings. Patients heart doctor wants the primary care provider to monitor his medications. Please call [phone] Thomas Dixon (Patients mother)

## 2023-10-26 NOTE — Telephone Encounter (Signed)
 Pharmacy Patient Advocate Encounter  Received notification from [facility] that Prior Authorization for Pantoprazole 40 mg dr tablets has been APPROVED from 06/22/2023 to 06/21/2024   PA #/Case ID/Reference #: [id number]

## 2023-10-27 NOTE — Telephone Encounter (Signed)
 Pt has been called LVM letting him know of his appt time and that he is on the wait list.

## 2023-10-28 ENCOUNTER — Telehealth: Payer: Self-pay | Admitting: General Practice

## 2023-10-28 NOTE — Telephone Encounter (Signed)
 Copied from CRM [id number]. Topic: General - Other >> Oct 28, 2023 11:25 AM Almira Coaster wrote: Reason for CRM: Patient's mother Vinal Rosengrant who is a patient of Dr.Burns got approval from Dr.Burns to establish care with [facility]. 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 Patient previously suffered a heart attack. [facility]'s best call back number is [phone].

## 2023-10-29 NOTE — Telephone Encounter (Signed)
 Ok to schedule him as anew patient visit at 8:10

## 2023-12-03 ENCOUNTER — Encounter: Payer: Self-pay | Admitting: Family Medicine

## 2023-12-03 ENCOUNTER — Ambulatory Visit (INDEPENDENT_AMBULATORY_CARE_PROVIDER_SITE_OTHER): Payer: 59 | Admitting: Family Medicine

## 2023-12-03 VITALS — BP 138/84 | HR 91 | Temp 97.6°F | Ht 70.5 in | Wt 230.4 lb

## 2023-12-03 DIAGNOSIS — R5383 Other fatigue: Secondary | ICD-10-CM

## 2023-12-03 DIAGNOSIS — K22719 Barrett's esophagus with dysplasia, unspecified: Secondary | ICD-10-CM

## 2023-12-03 DIAGNOSIS — E7801 Familial hypercholesterolemia: Secondary | ICD-10-CM

## 2023-12-03 DIAGNOSIS — I2510 Atherosclerotic heart disease of native coronary artery without angina pectoris: Secondary | ICD-10-CM | POA: Diagnosis not present

## 2023-12-03 MED ORDER — NEBIVOLOL HCL 2.5 MG PO TABS: 2.5000 mg | ORAL_TABLET | Freq: Every day | ORAL | 3 refills | Status: AC

## 2023-12-03 NOTE — Progress Notes (Signed)
 Assessment/Plan:    Assessment & Plan Coronary artery disease Coronary artery disease with NSTEMI in October 2024, managed with dual antiplatelet therapy (aspirin  and clopidogrel ) and ezetimibe  with low-dose rosuvastatin  due to statin intolerance. No current chest pain or dyspnea. Atenolol caused fatigue; nebivolol  is considered for its favorable side effect profile, including reduced fatigue and improved exercise tolerance. - Continue aspirin  81 mg daily and clopidogrel  75 mg daily. - Continue ezetimibe  10 mg daily and rosuvastatin  10 mg daily. - Switch atenolol 25 mg to nebivolol  2.5 mg daily to address fatigue and improve exercise tolerance. - Encourage cardiovascular reconditioning through targeted exercise, such as brisk walking or jogging for 20-30 minutes a few times a week.  Fatigue due to beta blocker Fatigue attributed to atenolol, exacerbated by post-MI status. Nebivolol  is considered for its cardioselective properties and reduced fatigue side effects, as well as a better metabolic profile, avoiding changes in blood glucose and weight gain. - Switch atenolol 25 mg to nebivolol  2.5 mg daily. - Monitor fatigue levels and blood pressure at home. - Re-evaluate fatigue and blood pressure in one month.  Familial hypercholesterolemia Managed with ezetimibe  and low-dose rosuvastatin  due to statin intolerance. Repatha is also part of the cholesterol management regimen. - Continue ezetimibe  10 mg daily and rosuvastatin  10 mg daily. - Continue Repatha as prescribed.  Barrett's esophagus Managed with pantoprazole , reduced to once daily due to improved symptoms and reduced acid reflux. - Continue pantoprazole  40 mg once daily.      Medications Discontinued During This Encounter  Medication Reason   atenolol (TENORMIN) 25 MG tablet     Return in about 1 month (around 01/03/2024) for physical (fasting labs), BP.    Subjective:   Encounter date: 12/03/2023  Thomas Dixon  is a 48 y.o. male who has HYPERLIPIDEMIA; RHINITIS; GERD; BARRETTS ESOPHAGUS; IBS; POLYARTHRITIS; MYALGIA; DIZZINESS; Fatigue; MUSCLE FASCICULATIONS; JAW PAIN; CHEST PAIN; ATTENTION OR CONCENTRATION DEFICIT; Pain in joint; Hyperlipidemia; Aneurysm of coronary vessels; Odynophagia; and Coronary artery disease involving native coronary artery of native heart without angina pectoris on their problem list..   He  has a past medical history of Allergy, Barrett's esophagus, Coronary artery disease, Gastritis, GERD (gastroesophageal reflux disease), Hiatal hernia, HLD (hyperlipidemia), Hyperlipidemia, Hypertension, IBS (irritable bowel syndrome), Kidney stones, and NSTEMI (non-ST elevated myocardial infarction) (HCC).Thomas Dixon   He presents with chief complaint of Establish Care (Would like to discuss b/p medication alternative and currently fatigued. ) .   Discussed the use of AI scribe software for clinical note transcription with the patient, who gave verbal consent to proceed.  History of Present Illness Thomas Dixon is a 48 year old male with hypertension and coronary artery disease who presents with fatigue and muscle aches possibly related to blood pressure medication.  He has been experiencing fatigue and muscle aches since starting atenolol following a non-ST elevation myocardial infarction (NSTEMI) in October 2024. Initially prescribed 25 mg of atenolol, he has been taking approximately 12.5 mg since March 2025 by cutting the pill in half due to significant fatigue, although he notes difficulty in achieving an exact dose due to the lack of a scoring mark.  He experienced a NSTEMI in October 2024 and was initially prescribed metoprolol in the hospital, which he did not tolerate well due to dizziness. He was switched to atenolol for convenience of dosing. He has a history of coronary artery disease and familial hypercholesterolemia, and he is statin intolerant. His current medications include ezetimibe   10 mg daily, rosuvastatin  10  mg daily, aspirin  81 mg daily, and clopidogrel  75 mg daily.  He also has a history of Barrett's esophagus and is taking pantoprazole  40 mg once daily, reduced from twice daily due to improved symptoms and previous stomach issues potentially related to restricted blood flow. No current acid reflux symptoms.  He has gained approximately 15 pounds since his heart attack, despite maintaining a diet of coffee in the morning and water throughout the day. He is concerned about this weight gain. He is active, walking about 10,000 steps a day as part of his job as a Sports administrator for [employer], and tries to stay active on weekends.  No chest pain or shortness of breath but he feels tired during the day. He does not snore and has not been observed to stop breathing at night. He monitors his sleep with a smartwatch, which does not indicate sleep apnea.  He has not participated in cardiac rehabilitation post-heart attack due to contracting the flu shortly after hospital discharge.       Past Surgical History:  Procedure Laterality Date   BIOPSY  06/22/2023   Procedure: BIOPSY;  Surgeon: Ace Holder, MD;  Location: [facility] ENDOSCOPY;  Service: Gastroenterology;;   CARDIAC CATHETERIZATION  05/29/2023   [facility]   CHOLECYSTECTOMY N/A 01/03/2013   Procedure: LAPAROSCOPIC CHOLECYSTECTOMY;  Surgeon: Shela Derby, MD;  Location[facility] OR;  Service: General;  Laterality: N/A;   ESOPHAGOGASTRODUODENOSCOPY (EGD) WITH PROPOFOL  N/A 06/22/2023   Procedure: ESOPHAGOGASTRODUODENOSCOPY (EGD) WITH PROPOFOL ;  Surgeon: Ace Holder, MD;  Location: [facility]OSCOPY;  Service: Gastroenterology;  Laterality: N/A;   LEFT HEART CATHETERIZATION WITH CORONARY ANGIOGRAM N/A 09/02/2011   Procedure: LEFT HEART CATHETERIZATION WITH CORONARY ANGIOGRAM;  Surgeon: Jessica Morn, MD;  Location: [facility] CATH LAB;  Service: Cardiovascular;  Laterality: N/A;   TYMPANOSTOMY TUBE PLACEMENT     UPPER  GASTROINTESTINAL ENDOSCOPY      Outpatient Medications Prior to Visit  Medication Sig Dispense Refill   Acetaminophen  (TYLENOL  PO) Take by mouth as needed.     aspirin  EC 81 MG tablet Take 81 mg by mouth daily.     clopidogrel  (PLAVIX ) 75 MG tablet Take 75 mg by mouth daily.     ezetimibe  (ZETIA ) 10 MG tablet Take 10 mg by mouth daily.     mometasone (NASONEX) 50 MCG/ACT nasal spray Place 2 sprays into the nose daily.     nitroGLYCERIN  (NITROSTAT ) 0.4 MG SL tablet Place 0.4 mg under the tongue every 5 (five) minutes as needed. For chest pain     pantoprazole  (PROTONIX ) 40 MG tablet Take 1 tablet (40 mg total) by mouth 2 (two) times daily. (Patient taking differently: Take 40 mg by mouth daily.) 180 tablet 1   REPATHA SURECLICK 140 MG/ML SOAJ Inject 1 mL into the skin every 14 (fourteen) days.     rosuvastatin  (CRESTOR ) 10 MG tablet Take 10 mg by mouth every other day.     sucralfate  (CARAFATE ) 1 g tablet Take 1 tablet (1 g total) by mouth every 6 (six) hours as needed. Slowly dissolve 1 tablet in 1 tablespoon of water before ingesting 60 tablet 1   atenolol (TENORMIN) 25 MG tablet Take 12.5 mg by mouth daily.     No facility-administered medications prior to visit.    Family History  Problem Relation Age of Onset   Heart disease Mother    Colon polyps Mother    Colon polyps Father    Barrett's esophagus Father    Heart attack Father  Heart disease Brother    Heart disease Maternal Grandmother    Pancreatic cancer Paternal Grandfather    Heart disease Maternal Aunt        multiple   Heart disease Maternal Uncle        multiple   Heart disease Maternal Uncle    Colon cancer Neg Hx    Esophageal cancer Neg Hx    Prostate cancer Neg Hx    Rectal cancer Neg Hx    Stomach cancer Neg Hx     Social History   Socioeconomic History   Marital status: Married    Spouse name: Not on file   Number of children: 3   Years of education: Not on file   Highest education level:  Associate degree: occupational, Scientist, product/process development, or vocational program  Occupational History   Occupation: Publishing rights manager: [employer]  Tobacco Use   Smoking status: Former    Current packs/day: 0.00    Average packs/day: 2.0 packs/day for 14.0 years (28.0 ttl pk-yrs)    Types: Cigarettes    Start date: 11/18/1989    Quit date: 11/19/2003    Years since quitting: 20.0   Smokeless tobacco: Current    Types: Chew   Tobacco comments:    7 years ago as of 2012  Vaping Use   Vaping status: Never Used  Substance and Sexual Activity   Alcohol use: Yes    Alcohol/week: 5.0 standard drinks of alcohol    Types: 5 Cans of beer per week    Comment: occas   Drug use: No   Sexual activity: Yes    Birth control/protection: None  Other Topics Concern   Not on file  Social History Narrative   Not on file   Social Drivers of Health   Financial Resource Strain: Low Risk  (11/30/2023)   Overall Financial Resource Strain (CARDIA)    Difficulty of Paying Living Expenses: Not very hard  Food Insecurity: No Food Insecurity (11/30/2023)   Hunger Vital Sign    Worried About Running Out of Food in the Last Year: Never true    Ran Out of Food in the Last Year: Never true  Transportation Needs: No Transportation Needs (11/30/2023)   PRAPARE - Administrator, Civil Service (Medical): No    Lack of Transportation (Non-Medical): No  Physical Activity: Insufficiently Active (11/30/2023)   Exercise Vital Sign    Days of Exercise per Week: 2 days    Minutes of Exercise per Session: 50 min  Stress: No Stress Concern Present (11/30/2023)   [employer] of Occupational Health - Occupational Stress Questionnaire    Feeling of Stress : Not at all  Social Connections: Moderately Integrated (11/30/2023)   Social Connection and Isolation Panel [NHANES]    Frequency of Communication with Friends and Family: More than three times a week    Frequency of Social Gatherings with Friends and  Family: Once a week    Attends Religious Services: More than 4 times per year    Active Member of [employer] or Organizations: No    Attends Banker Meetings: Not on file    Marital Status: Married  Intimate Partner Violence: Unknown (11/04/2021)   Received from [employer], [facility]   HITS    Physically Hurt: Not on file    Insult or Talk Down To: Not on file    Threaten Physical Harm: Not on file    Scream or Curse: Not  on file                                                                                                  Objective:  Physical Exam: BP 138/84   Pulse 91   Temp 97.6 F (36.4 C) (Temporal)   Ht 5' 10.5" (1.791 m)   Wt 230 lb 6.4 oz (104.5 kg)   SpO2 97%   BMI 32.59 kg/m    Physical Exam GENERAL: Alert, cooperative, well developed, no acute distress HEENT: Normocephalic, normal oropharynx, moist mucous membranes CHEST: Clear to auscultation bilaterally, no wheezes, rhonchi, or crackles CARDIOVASCULAR: Normal heart rate and rhythm, S1 and S2 normal without murmurs ABDOMEN: Soft, non-tender, non-distended, without organomegaly, normal bowel sounds EXTREMITIES: No cyanosis or edema NEUROLOGICAL: Cranial nerves grossly intact, moves all extremities without gross motor or sensory deficit     No results found.  No results found for this or any previous visit (from the past 2160 hours).      Carnell Christian, MD, MS

## 2023-12-08 ENCOUNTER — Other Ambulatory Visit: Payer: Self-pay | Admitting: Gastroenterology

## 2024-01-05 ENCOUNTER — Ambulatory Visit (INDEPENDENT_AMBULATORY_CARE_PROVIDER_SITE_OTHER): Admitting: Family Medicine

## 2024-01-05 ENCOUNTER — Encounter: Payer: Self-pay | Admitting: Family Medicine

## 2024-01-05 VITALS — BP 119/76 | HR 88 | Temp 97.0°F | Resp 18 | Ht 70.0 in | Wt 230.6 lb

## 2024-01-05 DIAGNOSIS — K22719 Barrett's esophagus with dysplasia, unspecified: Secondary | ICD-10-CM

## 2024-01-05 DIAGNOSIS — E6609 Other obesity due to excess calories: Secondary | ICD-10-CM

## 2024-01-05 DIAGNOSIS — Z0000 Encounter for general adult medical examination without abnormal findings: Secondary | ICD-10-CM | POA: Diagnosis not present

## 2024-01-05 DIAGNOSIS — E782 Mixed hyperlipidemia: Secondary | ICD-10-CM | POA: Diagnosis not present

## 2024-01-05 DIAGNOSIS — E66811 Obesity, class 1: Secondary | ICD-10-CM | POA: Diagnosis not present

## 2024-01-05 DIAGNOSIS — I2510 Atherosclerotic heart disease of native coronary artery without angina pectoris: Secondary | ICD-10-CM

## 2024-01-05 DIAGNOSIS — I2541 Coronary artery aneurysm: Secondary | ICD-10-CM

## 2024-01-05 DIAGNOSIS — K219 Gastro-esophageal reflux disease without esophagitis: Secondary | ICD-10-CM | POA: Diagnosis not present

## 2024-01-05 DIAGNOSIS — Z6833 Body mass index (BMI) 33.0-33.9, adult: Secondary | ICD-10-CM

## 2024-01-05 LAB — URIC ACID: Uric Acid, Serum: 6.5 mg/dL (ref 4.0–7.8)

## 2024-01-05 LAB — COMPREHENSIVE METABOLIC PANEL WITH GFR
ALT: 21 U/L (ref 0–53)
AST: 20 U/L (ref 0–37)
Albumin: 4.3 g/dL (ref 3.5–5.2)
Alkaline Phosphatase: 56 U/L (ref 39–117)
BUN: 9 mg/dL (ref 6–23)
CO2: 27 meq/L (ref 19–32)
Calcium: 9.5 mg/dL (ref 8.4–10.5)
Chloride: 104 meq/L (ref 96–112)
Creatinine, Ser: 1.07 mg/dL (ref 0.40–1.50)
GFR: 83.82 mL/min (ref >60.00)
Glucose, Bld: 107 mg/dL — ABNORMAL HIGH (ref 70–99)
Potassium: 4.2 meq/L (ref 3.5–5.1)
Sodium: 138 meq/L (ref 135–145)
Total Bilirubin: 0.9 mg/dL (ref 0.2–1.2)
Total Protein: 7 g/dL (ref 6.0–8.3)

## 2024-01-05 LAB — CBC WITH DIFFERENTIAL/PLATELET
Basophils Absolute: 0 10*3/uL (ref 0.0–0.1)
Basophils Relative: 0.6 % (ref 0.0–3.0)
Eosinophils Absolute: 0.2 10*3/uL (ref 0.0–0.7)
Eosinophils Relative: 2.6 % (ref 0.0–5.0)
HCT: 46.3 % (ref 39.0–52.0)
Hemoglobin: 15.4 g/dL (ref 13.0–17.0)
Lymphocytes Relative: 32.2 % (ref 12.0–46.0)
Lymphs Abs: 2 10*3/uL (ref 0.7–4.0)
MCHC: 33.3 g/dL (ref 30.0–36.0)
MCV: 90.6 fl (ref 78.0–100.0)
Monocytes Absolute: 0.4 10*3/uL (ref 0.1–1.0)
Monocytes Relative: 7.1 % (ref 3.0–12.0)
Neutro Abs: 3.5 10*3/uL (ref 1.4–7.7)
Neutrophils Relative %: 57.5 % (ref 43.0–77.0)
Platelets: 198 10*3/uL (ref 150.0–400.0)
RBC: 5.11 Mil/uL (ref 4.22–5.81)
RDW: 14.6 % (ref 11.5–15.5)
WBC: 6.1 10*3/uL (ref 4.0–10.5)

## 2024-01-05 LAB — LIPID PANEL
Cholesterol: 114 mg/dL (ref 0–200)
HDL: 40.3 mg/dL (ref >39.00)
LDL Cholesterol: 47 mg/dL (ref 0–99)
NonHDL: 74.18
Total CHOL/HDL Ratio: 3
Triglycerides: 137 mg/dL (ref 0.0–149.0)
VLDL: 27.4 mg/dL (ref 0.0–40.0)

## 2024-01-05 LAB — MICROALBUMIN / CREATININE URINE RATIO
Creatinine,U: 170 mg/dL
Microalb Creat Ratio: UNDETERMINED mg/g (ref 0.0–30.0)
Microalb, Ur: <0.7 mg/dL

## 2024-01-05 LAB — HEMOGLOBIN A1C: Hgb A1c MFr Bld: 5.6 % (ref 4.6–6.5)

## 2024-01-05 LAB — TSH: TSH: 1.12 u[IU]/mL (ref 0.35–5.50)

## 2024-01-05 MED ORDER — PANTOPRAZOLE SODIUM 40 MG PO TBEC: 40.0000 mg | DELAYED_RELEASE_TABLET | Freq: Every day | ORAL | 3 refills | Status: AC

## 2024-01-05 NOTE — Progress Notes (Signed)
 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 Currently on Repatha, rosuvastatin , and ezetimibe  for cholesterol management. LDL was 84 mg/dL in February, which is satisfactory. Familial hypercholesterolemia contributes to elevated cholesterol levels. Lipoprotein A is within normal range, but apolipoprotein B was elevated. Reports significant improvement in energy levels since medication adjustments. Emphasized maintaining LDL levels below 70 mg/dL as per cardiologist's recommendation. - Continue Repatha, rosuvastatin , and ezetimibe  - Order lab work to monitor cholesterol levels and novel markers  Hypertension Managed with nebivolol , significantly improving symptoms of fatigue compared to atenolol. Home blood pressure readings show mildly elevated diastolic values, but today's reading was 119/74 mmHg, which is satisfactory. Experiences occasional lightheadedness, likely due to medication. Advised on hydration to manage lightheadedness, as dehydration can exacerbate symptoms. - Continue nebivolol  - Monitor blood pressure at home - Advise on hydration to manage lightheadedness  Obesity Weight has increased since myocardial infarction, currently above previous range of 200-210 lbs. Discussed potential use of Wegovy for weight loss and cardiac protection. ZOXWRU, an injectable medication originally for diabetes, is now approved for cardiac protection and weight loss. It may have gastrointestinal side effects, which is a concern due to existing GI issues. Likely covered by insurance due to cardiac history. - Provide information on Wegovy for weight loss and cardiac protection - Order baseline lab work to assess suitability for [facility]  Kidney Stones Mild pain suggests possible stone. Cannot take tamsulosin due to chest pain side effects and prefers Advil  for pain management despite cardiologist's  advice against it. Advised to follow up with urology if symptoms worsen. - Order urinalysis to check for blood and stone traces - Consider checking uric acid levels - Advise to follow up with urology if symptoms worsen  Gastroesophageal Reflux Disease (GERD) Managed with Protonix  40 mg. Sucralfate  is used as needed for breakthrough symptoms. Reports well-controlled symptoms and has not needed sucralfate  recently. - Continue Protonix  40 mg - Use sucralfate  as needed for breakthrough symptoms  General Health Maintenance Discussed colon cancer screening options and recommended starting with Cologuard, a non-invasive test detecting cancer DNA in stool, with follow-up colonoscopy if positive. Offered hepatitis C and HIV screening as part of routine health maintenance. Advised on dental and eye care follow-ups. - Order Cologuard for colon cancer screening - Offer hepatitis C and HIV screening - Recommend dental check-up - Advise on regular eye exams  F/U in 1 year      Medications Discontinued During This Encounter  Medication Reason   pantoprazole  (PROTONIX ) 40 MG tablet Reorder    Patient Counseling(The following topics were reviewed and/or handout was given):  -Nutrition: Stressed importance of moderation in sodium/caffeine intake, saturated fat and cholesterol, caloric balance, sufficient intake of fresh fruits, vegetables, and fiber.  -Stressed the importance of regular exercise.   -Substance Abuse: Discussed cessation/primary prevention of tobacco, alcohol, or other drug use; driving or other dangerous activities under the influence; availability of treatment for abuse.   -Injury prevention: Discussed safety belts, safety helmets, smoke detector, smoking near bedding or upholstery.   -Sexuality: Discussed sexually transmitted diseases, partner selection, use of condoms, avoidance of unintended pregnancy and contraceptive alternatives.   -Dental health: Discussed importance of  regular tooth brushing, flossing, and dental visits.  -Health maintenance and immunizations reviewed. Please refer to Health maintenance section.          Subjective:   Encounter date: 01/05/2024  Chief Complaint  Patient presents with  Annual Exam    Pt is fasting today. Rx refill request for pantoprazole  40 MG  HM due- Prevnar 20, colonoscopy (pt declined)    Hypertension    Pt c/o of elevated diastolic readings at home with ranges from 125/84.     Discussed the use of AI scribe software for clinical note transcription with the patient, who gave verbal consent to proceed.  History of Present Illness Thomas Dixon "[name]" is a 48 year old male with hypertension and coronary artery disease who presents for follow-up after medication adjustment.  He recently switched from atenolol to nebivolol  due to fatigue and reports a significant improvement in energy levels, describing the change as 'night and day.' He is now able to engage in outdoor activities such as yard work without feeling exhausted. However, he experiences occasional lightheadedness, which is transient and manageable.  He monitors his blood pressure at home and has noted mildly elevated diastolic readings, typically in the 80s, but was pleased with a reading of 119/74 this morning. His previous readings were around 138/84. He is currently taking nebivolol  and Repatha, and he is fasting today for lab work.  He has a history of a heart attack in October 2024 and is on Repatha and rosuvastatin  (Crestor ) 10 mg, which he tolerates well after experiencing muscle aches on a higher dose. His LDL cholesterol was 84 in February 2025. He has familial hypercholesterolemia, with both parents having high cholesterol levels.  He has a history of GERD and is currently taking Protonix  40 mg. He uses sucralfate  as needed for breakthrough symptoms, though he hasn't needed it recently.  He has a history of kidney stones and woke up  with mild kidney stone pain today. He manages the pain with Advil . He has a history of high uric acid levels but has not had them checked recently.  He has not seen a dentist in the past six months but visited an eye doctor in November 2024. He has a history of foot pain, which he manages with Dr. Randon Butters inserts in his work boots. No snoring, abdominal pain, or back pain.       01/05/2024    8:52 AM 12/03/2023    9:34 AM  Depression screen PHQ 2/9  Decreased Interest 0 0  Down, Depressed, Hopeless 0 0  PHQ - 2 Score 0 0  Altered sleeping 0 0  Tired, decreased energy 0 1  Change in appetite 0 0  Feeling bad or failure about yourself  0 0  Trouble concentrating 0 0  Moving slowly or fidgety/restless 0 0  Suicidal thoughts 0 0  PHQ-9 Score 0 1  Difficult doing work/chores Not difficult at all Not difficult at all       01/05/2024    8:52 AM 12/03/2023    9:34 AM  GAD 7 : Generalized Anxiety Score  Nervous, Anxious, on Edge 0 0  Control/stop worrying 0 0  Worry too much - different things 0 0  Trouble relaxing 0 0  Restless 0 0  Easily annoyed or irritable 0 0  Afraid - awful might happen 0 0  Total GAD 7 Score 0 0  Anxiety Difficulty Not difficult at all Not difficult at all    Health Maintenance Due  Topic Date Due   HIV Screening  Never done   Hepatitis C Screening  Never done   Colonoscopy  Never done      PMH:  The following were reviewed and entered/updated in epic: Past Medical  History:  Diagnosis Date   Allergy    Barrett's esophagus    Coronary artery disease    ANEUYSM IN VEIN ON HEART   Gastritis    GERD (gastroesophageal reflux disease)    Hiatal hernia    NO LONGER PRESENT WITH LAST ENDOSCOPY   HLD (hyperlipidemia)    Hyperlipidemia    Hypertension    IBS (irritable bowel syndrome)    Kidney stones    NSTEMI (non-ST elevated myocardial infarction) [facility])     Patient Active Problem List   Diagnosis Date Noted   Coronary artery disease involving  native coronary artery of native heart without angina pectoris 12/03/2023   Odynophagia 06/22/2023   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   Hyperlipidemia 09/01/2011   Pain in joint 08/01/2010   POLYARTHRITIS 07/10/2010   Fatigue 07/10/2010   MUSCLE FASCICULATIONS 07/10/2010   ATTENTION OR CONCENTRATION DEFICIT 07/10/2010   RHINITIS 01/22/2010   HYPERLIPIDEMIA 01/01/2010   DIZZINESS 01/01/2010   JAW PAIN 01/01/2010   CHEST PAIN 01/01/2010   MYALGIA 06/07/2008   GERD 11/17/2007   IBS 11/17/2007   BARRETTS ESOPHAGUS 05/15/2006    Past Surgical History:  Procedure Laterality Date   BIOPSY  06/22/2023   Procedure: BIOPSY;  Surgeon: Ace Holder, MD;  Location: [facility];  Service: Gastroenterology;;   CARDIAC CATHETERIZATION  05/29/2023   [facility]   CHOLECYSTECTOMY N/A 01/03/2013   Procedure: LAPAROSCOPIC CHOLECYSTECTOMY;  Surgeon: Shela Derby, MD;  Location: [facility] OR;  Service: General;  Laterality: N/A;   ESOPHAGOGASTRODUODENOSCOPY (EGD) WITH PROPOFOL  N/A 06/22/2023   Procedure: ESOPHAGOGASTRODUODENOSCOPY (EGD) WITH PROPOFOL ;  Surgeon: Ace Holder, MD;  Location: [facility] ENDOSCOPY;  Service: Gastroenterology;  Laterality: N/A;   LEFT HEART CATHETERIZATION WITH CORONARY ANGIOGRAM N/A 09/02/2011   Procedure: LEFT HEART CATHETERIZATION WITH CORONARY ANGIOGRAM;  Surgeon: Jessica Morn, MD;  Location: [facility] CATH LAB;  Service: Cardiovascular;  Laterality: N/A;   TYMPANOSTOMY TUBE PLACEMENT     UPPER GASTROINTESTINAL ENDOSCOPY      Family History  Problem Relation Age of Onset   Heart disease Mother    Colon polyps Mother    Colon polyps Father    Barrett's esophagus Father    Heart attack Father    Heart disease Brother    Heart disease Maternal Grandmother    Pancreatic cancer Paternal Grandfather    Heart disease Maternal Aunt        multiple   Heart disease Maternal Uncle        multiple   Heart disease Maternal Uncle    Colon cancer Neg Hx     Esophageal cancer Neg Hx    Prostate cancer Neg Hx    Rectal cancer Neg Hx    Stomach cancer Neg Hx     Medications- reviewed and updated Outpatient Medications Prior to Visit  Medication Sig Dispense Refill   Acetaminophen  (TYLENOL  PO) Take by mouth as needed.     aspirin  EC 81 MG tablet Take 81 mg by mouth daily.     clopidogrel  (PLAVIX ) 75 MG tablet Take 75 mg by mouth daily.     ezetimibe  (ZETIA ) 10 MG tablet Take 10 mg by mouth daily.     mometasone (NASONEX) 50 MCG/ACT nasal spray Place 2 sprays into the nose daily.     nebivolol  (BYSTOLIC ) 2.5 MG tablet Take 1 tablet (2.5 mg total) by mouth daily. 90 tablet 3   nitroGLYCERIN  (NITROSTAT ) 0.4 MG SL tablet Place 0.4 mg under the tongue every 5 (five) minutes  as needed. For chest pain     REPATHA SURECLICK 140 MG/ML SOAJ Inject 1 mL into the skin every 14 (fourteen) days.     rosuvastatin  (CRESTOR ) 10 MG tablet Take 10 mg by mouth every other day.     sucralfate  (CARAFATE ) 1 g tablet Take 1 tablet (1 g total) by mouth every 6 (six) hours as needed. Slowly dissolve 1 tablet in 1 tablespoon of water before ingesting 60 tablet 1   pantoprazole  (PROTONIX ) 40 MG tablet Take 1 tablet (40 mg total) by mouth daily. 90 tablet 0   No facility-administered medications prior to visit.    Allergies  Allergen Reactions   Aciphex [Rabeprazole Sodium]    Percocet [Oxycodone -Acetaminophen ] Itching   Zocor [Simvastatin]     REACTION: muscle aches, eye symptoms, fatigue   Rabeprazole Sodium Rash    "aciphex" causes whelts    Social History   Socioeconomic History   Marital status: Married    Spouse name: Not on file   Number of children: 3   Years of education: Not on file   Highest education level: Associate degree: occupational, Scientist, product/process development, or vocational program  Occupational History   Occupation: Publishing rights manager: [employer]  Tobacco Use   Smoking status: Former    Current packs/day: 0.00    Average packs/day: 2.0  packs/day for 14.0 years (28.0 ttl pk-yrs)    Types: Cigarettes    Start date: 11/18/1989    Quit date: 11/19/2003    Years since quitting: 20.1   Smokeless tobacco: Current    Types: Chew   Tobacco comments:    7 years ago as of 2012  Vaping Use   Vaping status: Never Used  Substance and Sexual Activity   Alcohol use: Yes    Alcohol/week: 5.0 standard drinks of alcohol    Types: 5 Cans of beer per week    Comment: occas   Drug use: No   Sexual activity: Yes    Birth control/protection: None  Other Topics Concern   Not on file  Social History Narrative   Not on file   Social Drivers of Health   Financial Resource Strain: Low Risk  (11/30/2023)   Overall Financial Resource Strain (CARDIA)    Difficulty of Paying Living Expenses: Not very hard  Food Insecurity: No Food Insecurity (11/30/2023)   Hunger Vital Sign    Worried About Running Out of Food in the Last Year: Never true    Ran Out of Food in the Last Year: Never true  Transportation Needs: No Transportation Needs (11/30/2023)   PRAPARE - Administrator, Civil Service (Medical): No    Lack of Transportation (Non-Medical): No  Physical Activity: Insufficiently Active (11/30/2023)   Exercise Vital Sign    Days of Exercise per Week: 2 days    Minutes of Exercise per Session: 50 min  Stress: No Stress Concern Present (11/30/2023)   [employer] of Occupational Health - Occupational Stress Questionnaire    Feeling of Stress : Not at all  Social Connections: Moderately Integrated (11/30/2023)   Social Connection and Isolation Panel [NHANES]    Frequency of Communication with Friends and Family: More than three times a week    Frequency of Social Gatherings with Friends and Family: Once a week    Attends Religious Services: More than 4 times per year    Active Member of [employer] or Organizations: No    Attends Banker Meetings: Not on  file    Marital Status: Married           Objective:   Physical Exam: BP 119/76 (BP Location: Left Arm, Patient Position: Sitting, Cuff Size: Large)   Pulse 88   Temp (!) 97 F (36.1 C) (Temporal)   Resp 18   Ht 5\' 10"  (1.778 m)   Wt 230 lb 9.6 oz (104.6 kg)   SpO2 98%   BMI 33.09 kg/m   Body mass index is 33.09 kg/m. Wt Readings from Last 3 Encounters:  01/05/24 230 lb 9.6 oz (104.6 kg)  12/03/23 230 lb 6.4 oz (104.5 kg)  06/22/23 223 lb 1.7 oz (101.2 kg)    Physical Exam Constitutional:      General: He is not in acute distress.    Appearance: Normal appearance. He is not ill-appearing or toxic-appearing.  HENT:     Head: Normocephalic and atraumatic.     Right Ear: Hearing, tympanic membrane, ear canal and external ear normal. There is no impacted cerumen.     Left Ear: Hearing, tympanic membrane, ear canal and external ear normal. There is no impacted cerumen.     Nose: Nose normal. No congestion.     Mouth/Throat:     Lips: No lesions.     Mouth: Mucous membranes are moist.     Pharynx: Oropharynx is clear. No oropharyngeal exudate.  Eyes:     General: No scleral icterus.       Right eye: No discharge.        Left eye: No discharge.     Conjunctiva/sclera: Conjunctivae normal.     Pupils: Pupils are equal, round, and reactive to light.  Neck:     Thyroid: No thyroid mass, thyromegaly or thyroid tenderness.  Cardiovascular:     Rate and Rhythm: Normal rate and regular rhythm.     Pulses: Normal pulses.     Heart sounds: Normal heart sounds.  Pulmonary:     Effort: Pulmonary effort is normal. No respiratory distress.     Breath sounds: Normal breath sounds.  Abdominal:     General: Abdomen is flat. Bowel sounds are normal.     Palpations: Abdomen is soft.  Musculoskeletal:        General: Normal range of motion.     Cervical back: Normal range of motion.     Right lower leg: No edema.     Left lower leg: No edema.  Lymphadenopathy:     Cervical: No cervical adenopathy.  Skin:    General: Skin is warm and  dry.     Findings: No rash.  Neurological:     General: No focal deficit present.     Mental Status: He is alert and oriented to person, place, and time. Mental status is at baseline.     Deep Tendon Reflexes:     Reflex Scores:      Patellar reflexes are 2+ on the right side and 2+ on the left side. Psychiatric:        Mood and Affect: Mood normal.        Behavior: Behavior normal.        Thought Content: Thought content normal.        Judgment: Judgment normal.         Prior labs:   No results found for this or any previous visit (from the past 2160 hours).  Lab Results  Component Value Date   CHOL 218 (H) 09/02/2011   CHOL 267 (H) 06/13/2011  CHOL 252 (H) 02/12/2011   Lab Results  Component Value Date   HDL 37 (L) 09/02/2011   HDL 36.30 (L) 06/13/2011   HDL 34.70 (L) 02/12/2011   Lab Results  Component Value Date   LDLCALC 166 (H) 09/02/2011   Lab Results  Component Value Date   TRIG 77 09/02/2011   TRIG 83.0 06/13/2011   TRIG 86.0 02/12/2011   Lab Results  Component Value Date   CHOLHDL 5.9 09/02/2011   CHOLHDL 7 06/13/2011   CHOLHDL 7 02/12/2011   Lab Results  Component Value Date   LDLDIRECT 243.4 06/13/2011   LDLDIRECT 225.1 02/12/2011   LDLDIRECT 265.9 10/10/2010    Last metabolic panel Lab Results  Component Value Date   GLUCOSE 158 (H) 01/19/2020   NA 138 01/19/2020   K 3.9 01/19/2020   CL 104 01/19/2020   CO2 24 01/19/2020   BUN 18 01/19/2020   CREATININE 1.22 01/19/2020   GFRNONAA >60 01/19/2020   CALCIUM  9.2 01/19/2020   PROT 7.7 05/07/2015   ALBUMIN 4.4 05/07/2015   BILITOT 0.4 05/07/2015   ALKPHOS 59 05/07/2015   ALKPHOS 59 05/07/2015   AST 18 05/07/2015   ALT 16 05/07/2015   ANIONGAP 10 01/19/2020    Lab Results  Component Value Date   HGBA1C 5.5 09/02/2011    Last CBC Lab Results  Component Value Date   WBC 16.6 (H) 01/19/2020   HGB 15.7 01/19/2020   HCT 47.0 01/19/2020   MCV 88.0 01/19/2020   MCH 29.4  01/19/2020   RDW 13.2 01/19/2020   PLT 260 01/19/2020    Lab Results  Component Value Date   TSH 0.852 09/02/2011    No results found for: "PSA1", "PSA"  Last vitamin D  Lab Results  Component Value Date   VD25OH 49 09/02/2011    Lab Results  Component Value Date   BILIRUBINUR NEGATIVE 01/19/2020   PROTEINUR NEGATIVE 01/19/2020   UROBILINOGEN 1.0 01/23/2015   LEUKOCYTESUR NEGATIVE 01/19/2020    No results found for: "LABMICR", "MICROALBUR"   At today's visit, we discussed treatment options, associated risk and benefits, and engage in counseling as needed.  Additionally the following were reviewed: Past medical records, past medical and surgical history, family and social background, as well as relevant laboratory results, imaging findings, and specialty notes, where applicable.  This message was generated using dictation software, and as a result, it may contain unintentional typos or errors.  Nevertheless, extensive effort was made to accurately convey at the pertinent aspects of the patient visit.    There may have been are other unrelated non-urgent complaints, but due to the busy schedule and the amount of time already spent with him, time does not permit to address these issues at today's visit. Another appointment may have or has been requested to review these additional issues.     Harle Libra, MD, MS

## 2024-01-07 LAB — UA/M W/RFLX CULTURE, ROUTINE
Bilirubin, UA: NEGATIVE
Glucose, UA: NEGATIVE
Ketones, UA: NEGATIVE
Leukocytes,UA: NEGATIVE
Nitrite, UA: NEGATIVE
Protein,UA: NEGATIVE
RBC, UA: NEGATIVE
Specific Gravity, UA: 1.018 (ref 1.005–1.030)
Urobilinogen, Ur: 0.2 mg/dL (ref 0.2–1.0)
pH, UA: 7 (ref 5.0–7.5)

## 2024-01-07 LAB — MICROSCOPIC EXAMINATION
Bacteria, UA: NONE SEEN
Casts: NONE SEEN /LPF
Epithelial Cells (non renal): NONE SEEN /HPF (ref 0–10)
WBC, UA: NONE SEEN /HPF (ref 0–5)

## 2024-01-08 ENCOUNTER — Telehealth: Payer: Self-pay

## 2024-01-08 ENCOUNTER — Encounter: Payer: Self-pay | Admitting: Family Medicine

## 2024-01-08 ENCOUNTER — Other Ambulatory Visit (HOSPITAL_COMMUNITY): Payer: Self-pay

## 2024-01-08 DIAGNOSIS — I2510 Atherosclerotic heart disease of native coronary artery without angina pectoris: Secondary | ICD-10-CM

## 2024-01-08 LAB — VITAMIN D 1,25 DIHYDROXY
Vitamin D 1, 25 (OH)2 Total: 45 pg/mL (ref 18–72)
Vitamin D2 1, 25 (OH)2: <8 pg/mL
Vitamin D3 1, 25 (OH)2: 45 pg/mL

## 2024-01-08 MED ORDER — WEGOVY 0.25 MG/0.5ML ~~LOC~~ SOAJ: 0.2500 mg | SUBCUTANEOUS | 0 refills | Status: AC

## 2024-01-08 NOTE — Telephone Encounter (Signed)
 Pharmacy Patient Advocate Encounter   Received notification from CoverMyMeds that prior authorization for Wegovy 0.25 is required/requested.   Insurance verification completed.   The patient is insured through [facility] .   Per test claim: PA required; PA submitted to above mentioned insurance via CoverMyMeds Key/confirmation #/EOC [employer] Insurance Status is pending

## 2024-01-11 ENCOUNTER — Ambulatory Visit: Payer: Self-pay | Admitting: Family Medicine

## 2024-01-11 ENCOUNTER — Other Ambulatory Visit (HOSPITAL_COMMUNITY): Payer: Self-pay

## 2024-01-11 NOTE — Telephone Encounter (Signed)
 Pharmacy Patient Advocate Encounter  Received notification from [employer] that Prior Authorization for Wegovy  0.25 has been APPROVED from 01/11/24 to 01/10/25   PA #/Case ID/Reference #: [id number] Unable to run test claim, patient must enroll in Calibrate program

## 2024-01-13 NOTE — Telephone Encounter (Signed)
 Contacted pharmacy and update to pharmacy tech about PA approval. She states that prescriber id is invalid. Was put on hold to find out more information.   Hang up the call-grabbing pt.   Return a call to pharmacy and spoke to [facility]. She states that pt needs to enroll in a program and their pharmacy tech had spoke to pt about enrolling into a program.

## 2024-01-14 NOTE — Telephone Encounter (Signed)
 Called and left VM to return call to office. Please refer to prior notes about calibrate program enrollment information needed for Wegovy .

## 2024-03-30 ENCOUNTER — Encounter: Payer: Self-pay | Admitting: Gastroenterology

## 2024-06-07 ENCOUNTER — Other Ambulatory Visit (HOSPITAL_COMMUNITY): Payer: Self-pay

## 2024-06-07 NOTE — Telephone Encounter (Signed)
 ERROR

## 2024-06-09 ENCOUNTER — Other Ambulatory Visit (HOSPITAL_COMMUNITY): Payer: Self-pay

## 2024-07-20 ENCOUNTER — Encounter (INDEPENDENT_AMBULATORY_CARE_PROVIDER_SITE_OTHER): Payer: Self-pay | Admitting: Family Medicine

## 2024-07-20 DIAGNOSIS — Z20828 Contact with and (suspected) exposure to other viral communicable diseases: Secondary | ICD-10-CM

## 2024-07-21 DIAGNOSIS — Z20828 Contact with and (suspected) exposure to other viral communicable diseases: Secondary | ICD-10-CM | POA: Diagnosis not present

## 2024-07-21 MED ORDER — OSELTAMIVIR PHOSPHATE 75 MG PO CAPS: 75.0000 mg | ORAL_CAPSULE | Freq: Every day | ORAL | 0 refills | Status: AC

## 2024-07-21 NOTE — Telephone Encounter (Signed)
° ° °  Chief Complaint:  OLUWATOMIWA KINYON is a 48 y.o. male who presents for a MyChart e-visit.  Assessment/Plan:  New/Acute Problems:  Exposure to influenza Exposure to Influenza from Wife Requesting Prophylaxis  Oseltamivir  75 mg daily for 10 days Please follow-up if you develop symptoms of influenza including abdominal pain, fevers or chills, chest pain, shortness of breath    Subjective:  HPI:  Reports Exposure to Influenza from Wife 1 day ago. He is requesting Prophylaxis.        Objective:   No results found for this or any previous visit (from the past 72 hours).   MyChart e-Visit   Contact was initiated by the patient on 07/20/2024 via MyChart. The limitations of evaluation and management by telemedicine and the availability of in person appointments were discussed. The patient expressed understanding and agreed to proceed.   Persons participating in the virtual visit: Myself   A total of 6 minutes were spent on medical discussion for the 7 day period starting on 07/21/2024' . There is no separately reported E/M service related to this service in the 7 days preceding date of initial contact nor does the patient have an upcoming soonest available appointment for this issue.       Arvella Hummer, MD, MS 10:50 PM 07/21/2024

## 2024-07-21 NOTE — Assessment & Plan Note (Signed)
 Exposure to Influenza from Wife Requesting Prophylaxis  Oseltamivir  75 mg daily for 10 days Please follow-up if you develop symptoms of influenza including abdominal pain, fevers or chills, chest pain, shortness of breath
